# Patient Record
Sex: Female | Born: 1955 | Race: White | Hispanic: No | Marital: Married | Smoking: Never smoker
Health system: Southern US, Community
[De-identification: ages and names within clinical notes are randomized; demographics above are authoritative.]

## PROBLEM LIST (undated history)

## (undated) DIAGNOSIS — J45909 Unspecified asthma, uncomplicated: Secondary | ICD-10-CM

## (undated) DIAGNOSIS — I10 Essential (primary) hypertension: Secondary | ICD-10-CM

## (undated) HISTORY — DX: Essential (primary) hypertension: I10

## (undated) HISTORY — PX: LAPAROSCOPIC HYSTERECTOMY: SHX1926

## (undated) HISTORY — PX: SHOULDER ARTHROSCOPY: SHX128

## (undated) HISTORY — PX: AUGMENTATION MAMMAPLASTY: SUR837

## (undated) HISTORY — PX: BREAST BIOPSY: SHX20

## (undated) HISTORY — DX: Unspecified asthma, uncomplicated: J45.909

---

## 1998-02-13 ENCOUNTER — Ambulatory Visit (HOSPITAL_COMMUNITY): Admission: RE | Admit: 1998-02-13 | Discharge: 1998-02-13 | Payer: Self-pay | Admitting: Gynecology

## 1998-12-04 ENCOUNTER — Other Ambulatory Visit: Admission: RE | Admit: 1998-12-04 | Discharge: 1998-12-04 | Payer: Self-pay | Admitting: *Deleted

## 1999-02-16 ENCOUNTER — Ambulatory Visit (HOSPITAL_COMMUNITY): Admission: RE | Admit: 1999-02-16 | Discharge: 1999-02-16 | Payer: Self-pay | Admitting: Gynecology

## 1999-02-16 ENCOUNTER — Encounter: Payer: Self-pay | Admitting: Gynecology

## 1999-04-29 ENCOUNTER — Other Ambulatory Visit: Admission: RE | Admit: 1999-04-29 | Discharge: 1999-04-29 | Payer: Self-pay | Admitting: Gynecology

## 2000-02-24 ENCOUNTER — Encounter: Payer: Self-pay | Admitting: Gynecology

## 2000-02-24 ENCOUNTER — Encounter: Admission: RE | Admit: 2000-02-24 | Discharge: 2000-02-24 | Payer: Self-pay | Admitting: Gynecology

## 2000-04-05 ENCOUNTER — Other Ambulatory Visit: Admission: RE | Admit: 2000-04-05 | Discharge: 2000-04-05 | Payer: Self-pay | Admitting: Gynecology

## 2000-08-03 ENCOUNTER — Other Ambulatory Visit: Admission: RE | Admit: 2000-08-03 | Discharge: 2000-08-03 | Payer: Self-pay | Admitting: Gynecology

## 2001-03-08 ENCOUNTER — Encounter: Payer: Self-pay | Admitting: Gynecology

## 2001-03-08 ENCOUNTER — Ambulatory Visit (HOSPITAL_COMMUNITY): Admission: RE | Admit: 2001-03-08 | Discharge: 2001-03-08 | Payer: Self-pay | Admitting: Pediatrics

## 2001-04-12 ENCOUNTER — Other Ambulatory Visit: Admission: RE | Admit: 2001-04-12 | Discharge: 2001-04-12 | Payer: Self-pay | Admitting: Gynecology

## 2002-04-04 ENCOUNTER — Encounter: Admission: RE | Admit: 2002-04-04 | Discharge: 2002-04-04 | Payer: Self-pay | Admitting: Gynecology

## 2002-04-04 ENCOUNTER — Encounter: Payer: Self-pay | Admitting: Gynecology

## 2002-04-04 ENCOUNTER — Other Ambulatory Visit: Admission: RE | Admit: 2002-04-04 | Discharge: 2002-04-04 | Payer: Self-pay | Admitting: Gynecology

## 2003-03-27 ENCOUNTER — Other Ambulatory Visit: Admission: RE | Admit: 2003-03-27 | Discharge: 2003-03-27 | Payer: Self-pay | Admitting: Gynecology

## 2003-03-27 ENCOUNTER — Encounter: Admission: RE | Admit: 2003-03-27 | Discharge: 2003-03-27 | Payer: Self-pay | Admitting: Gynecology

## 2003-03-27 ENCOUNTER — Encounter: Payer: Self-pay | Admitting: Gynecology

## 2003-04-30 ENCOUNTER — Encounter (INDEPENDENT_AMBULATORY_CARE_PROVIDER_SITE_OTHER): Payer: Self-pay | Admitting: *Deleted

## 2003-04-30 ENCOUNTER — Ambulatory Visit (HOSPITAL_COMMUNITY): Admission: RE | Admit: 2003-04-30 | Discharge: 2003-04-30 | Payer: Self-pay | Admitting: General Surgery

## 2003-04-30 ENCOUNTER — Ambulatory Visit (HOSPITAL_BASED_OUTPATIENT_CLINIC_OR_DEPARTMENT_OTHER): Admission: RE | Admit: 2003-04-30 | Discharge: 2003-04-30 | Payer: Self-pay | Admitting: General Surgery

## 2004-04-08 ENCOUNTER — Other Ambulatory Visit: Admission: RE | Admit: 2004-04-08 | Discharge: 2004-04-08 | Payer: Self-pay | Admitting: Gynecology

## 2004-04-29 ENCOUNTER — Encounter: Admission: RE | Admit: 2004-04-29 | Discharge: 2004-04-29 | Payer: Self-pay | Admitting: Gynecology

## 2004-07-08 ENCOUNTER — Ambulatory Visit: Payer: Self-pay | Admitting: Internal Medicine

## 2004-07-22 ENCOUNTER — Encounter: Admission: RE | Admit: 2004-07-22 | Discharge: 2004-07-22 | Payer: Self-pay | Admitting: Internal Medicine

## 2004-08-19 ENCOUNTER — Ambulatory Visit: Payer: Self-pay | Admitting: Internal Medicine

## 2004-08-19 ENCOUNTER — Ambulatory Visit: Admission: RE | Admit: 2004-08-19 | Discharge: 2004-08-19 | Payer: Self-pay | Admitting: Internal Medicine

## 2004-08-19 ENCOUNTER — Encounter (INDEPENDENT_AMBULATORY_CARE_PROVIDER_SITE_OTHER): Payer: Self-pay | Admitting: *Deleted

## 2004-08-31 ENCOUNTER — Ambulatory Visit: Payer: Self-pay | Admitting: Internal Medicine

## 2004-09-23 ENCOUNTER — Ambulatory Visit (HOSPITAL_COMMUNITY): Admission: RE | Admit: 2004-09-23 | Discharge: 2004-09-23 | Payer: Self-pay | Admitting: *Deleted

## 2005-04-07 ENCOUNTER — Other Ambulatory Visit: Admission: RE | Admit: 2005-04-07 | Discharge: 2005-04-07 | Payer: Self-pay | Admitting: Gynecology

## 2005-05-12 ENCOUNTER — Encounter: Admission: RE | Admit: 2005-05-12 | Discharge: 2005-05-12 | Payer: Self-pay | Admitting: Gynecology

## 2005-09-30 ENCOUNTER — Encounter: Admission: RE | Admit: 2005-09-30 | Discharge: 2005-09-30 | Payer: Self-pay | Admitting: Gynecology

## 2005-12-29 ENCOUNTER — Ambulatory Visit: Payer: Self-pay | Admitting: Internal Medicine

## 2006-02-16 ENCOUNTER — Ambulatory Visit: Payer: Self-pay | Admitting: Internal Medicine

## 2006-04-18 ENCOUNTER — Encounter: Admission: RE | Admit: 2006-04-18 | Discharge: 2006-04-18 | Payer: Self-pay | Admitting: Gynecology

## 2006-04-20 ENCOUNTER — Other Ambulatory Visit: Admission: RE | Admit: 2006-04-20 | Discharge: 2006-04-20 | Payer: Self-pay | Admitting: Gynecology

## 2006-05-18 ENCOUNTER — Encounter: Admission: RE | Admit: 2006-05-18 | Discharge: 2006-05-18 | Payer: Self-pay | Admitting: Gynecology

## 2006-12-26 ENCOUNTER — Inpatient Hospital Stay (HOSPITAL_COMMUNITY): Admission: AD | Admit: 2006-12-26 | Discharge: 2006-12-26 | Payer: Self-pay | Admitting: Obstetrics & Gynecology

## 2007-05-24 ENCOUNTER — Other Ambulatory Visit: Admission: RE | Admit: 2007-05-24 | Discharge: 2007-05-24 | Payer: Self-pay | Admitting: Gynecology

## 2007-05-24 ENCOUNTER — Encounter: Admission: RE | Admit: 2007-05-24 | Discharge: 2007-05-24 | Payer: Self-pay | Admitting: Gynecology

## 2007-06-01 ENCOUNTER — Encounter: Admission: RE | Admit: 2007-06-01 | Discharge: 2007-06-01 | Payer: Self-pay | Admitting: Gynecology

## 2008-05-15 ENCOUNTER — Other Ambulatory Visit: Admission: RE | Admit: 2008-05-15 | Discharge: 2008-05-15 | Payer: Self-pay | Admitting: Gynecology

## 2008-05-29 ENCOUNTER — Encounter: Admission: RE | Admit: 2008-05-29 | Discharge: 2008-05-29 | Payer: Self-pay | Admitting: Gynecology

## 2009-07-16 ENCOUNTER — Encounter: Admission: RE | Admit: 2009-07-16 | Discharge: 2009-07-16 | Payer: Self-pay | Admitting: Gynecology

## 2010-04-26 ENCOUNTER — Encounter: Admission: RE | Admit: 2010-04-26 | Discharge: 2010-04-26 | Payer: Self-pay | Admitting: Gynecology

## 2010-06-24 ENCOUNTER — Encounter: Admission: RE | Admit: 2010-06-24 | Discharge: 2010-06-24 | Payer: Self-pay | Admitting: Gynecology

## 2010-12-10 NOTE — Op Note (Signed)
   Donna King, Donna King                        ACCOUNT NO.:  [id number]   MEDICAL RECORD NO.:  [id number]                   PATIENT TYPE:  AMB   LOCATION:  DSC                                  FACILITY:  [facility]   PHYSICIAN:  Rose Phi. Maple Hudson, M.D.                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   OPERATION:  Excision of right breast mass.   SURGEON:  Rose Phi. Maple Hudson, M.D.   ANESTHESIA:  MAC.   OPERATIVE PROCEDURE:  The patient placed on the operating table with both  arms extended on the arm board.  We carefully marked where the small  palpable nodule was and then prepped and draped.  A small incision was then  outlined and the area infiltrated with 1% Xylocaine with adrenalin.  An  incision was made and the nodule was excised.  Hemostasis was obtained with  the cautery.   It appeared to be a benign fibroadenoma.   The deeper breast tissue was approximated with 3-0 Vicryl and the skin with  a subcuticular 4-0 Monocryl and Steri-Strips.  A dressing applied.  The  patient transferred to the recovery room in satisfactory condition, having  tolerated the procedure well.                                               Rose Phi. Maple Hudson, M.D.    PRY/MEDQ  D:  04/30/2003  T:  04/30/2003  Job:  [id number]

## 2010-12-10 NOTE — Op Note (Signed)
[name], Donna King              ACCOUNT NO.:  [id number]   MEDICAL RECORD NO.:  [id number]          PATIENT TYPE:  OUT   LOCATION:  CARD                         FACILITY:  [facility]   PHYSICIAN:  Charlaine Dalton. Sherene Sires, M.D. [facility] OF BIRTH:  31-Jan-1956   DATE OF PROCEDURE:  DATE OF DISCHARGE:                                 OPERATIVE REPORT   PROCEDURE:  Fiberoptic bronchoscopy with transbronchial biopsy of the left  lower lobe.   HISTORY AND INDICATIONS:  A 55 year old white female with chronic productive  cough and patches of reticular nodular infiltrates in the right lower and  left lower lobes.  Please see dictated office consultation notes for further  information.  The patient agrees to the procedure after full discussion of  risks, benefits, and alternatives in the office.   The procedure was performed in the bronchoscopy suite with continuous  monitoring by surface ECG and oximetry.  She received a total of 50 mg of IV  Demerol and 5 mg of IV Versed anesthesia and cough suppression.   The right naris and oropharynx were anesthetized with 1% lidocaine by  updraft nebulizer, and the right naris was additionally prepared with 2%  lidocaine jelly.   Using a standard video fiberoptic bronchoscope, the right naris was easily  cannulated with good visualization of the entire oropharynx and larynx.  The  cords moved normally, and there were no apparent upper airway lesions.   Using an additional 1% lidocaine anesthesia, the entire tracheobronchial  tree was explored with the following findings.  There were diffuse  mucopurulent secretions retained throughout all the major airways, equal  bilaterally.  The airways were suctioned free of these secretions, and the  secretions were sent for the usual studies.   PROCEDURE:  Using a transbronchial approach at the posterobasal segment on  the left (which corresponded to an area of patchy increased density on CT  scan), I obtained four  adequate biopsies by standard transbronchial  technique, using fluoroscopic guidance.  There was no significant evidence  of intrabronchial bleeding or pneumothorax by fluoroscopy, with a follow-up  chest x-ray showing no evidence of pneumothorax.   The patient did develop mild left pleuritic pain postprocedure, consistent  with either a very small pneumothorax or pleuritis at the subpleural  location of the biopsy.  It should be treated conservatively with analgesic,  and we will follow her up as an outpatient.   IMPRESSION:  Pulmonary infiltrates of undetermined etiology, associated with  copious mucopurulent secretions of undetermined etiology as well.  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      MBW/MEDQ  D:  08/19/2004  T:  08/19/2004  Job:  [id number]   cc:   Zola Button T. Lazarus Salines, M.D.  [address] [place]  [place] [address]  Fax: [phone]

## 2011-05-12 LAB — URINALYSIS, ROUTINE W REFLEX MICROSCOPIC
Glucose, UA: NEGATIVE
Ketones, ur: 15 — AB
Nitrite: NEGATIVE
Protein, ur: 30 — AB
Specific Gravity, Urine: 1.025
Urobilinogen, UA: 1
pH: 6

## 2011-05-12 LAB — URINE MICROSCOPIC-ADD ON

## 2011-05-16 ENCOUNTER — Other Ambulatory Visit: Payer: Self-pay | Admitting: Gynecology

## 2011-05-16 DIAGNOSIS — Z1231 Encounter for screening mammogram for malignant neoplasm of breast: Secondary | ICD-10-CM

## 2011-06-30 ENCOUNTER — Ambulatory Visit: Payer: Self-pay

## 2011-07-14 ENCOUNTER — Ambulatory Visit
Admission: RE | Admit: 2011-07-14 | Discharge: 2011-07-14 | Disposition: A | Discharge status: Home / Self Care | Payer: BC Managed Care – PPO | Source: Ambulatory Visit | Attending: Gynecology | Admitting: Gynecology

## 2011-07-14 DIAGNOSIS — Z1231 Encounter for screening mammogram for malignant neoplasm of breast: Secondary | ICD-10-CM

## 2012-02-02 ENCOUNTER — Other Ambulatory Visit: Payer: Self-pay | Admitting: Plastic Surgery

## 2012-06-04 ENCOUNTER — Other Ambulatory Visit: Payer: Self-pay | Admitting: Gynecology

## 2012-06-04 DIAGNOSIS — Z1231 Encounter for screening mammogram for malignant neoplasm of breast: Secondary | ICD-10-CM

## 2012-07-02 ENCOUNTER — Other Ambulatory Visit: Payer: Self-pay | Admitting: Gynecology

## 2012-07-02 DIAGNOSIS — Z9889 Other specified postprocedural states: Secondary | ICD-10-CM

## 2012-07-02 DIAGNOSIS — N63 Unspecified lump in unspecified breast: Secondary | ICD-10-CM

## 2012-07-19 ENCOUNTER — Ambulatory Visit
Admission: RE | Admit: 2012-07-19 | Discharge: 2012-07-19 | Disposition: A | Discharge status: Home / Self Care | Payer: BC Managed Care – PPO | Source: Ambulatory Visit | Attending: Gynecology | Admitting: Gynecology

## 2012-07-19 DIAGNOSIS — N63 Unspecified lump in unspecified breast: Secondary | ICD-10-CM

## 2012-07-19 DIAGNOSIS — Z1231 Encounter for screening mammogram for malignant neoplasm of breast: Secondary | ICD-10-CM

## 2012-07-19 DIAGNOSIS — Z9889 Other specified postprocedural states: Secondary | ICD-10-CM

## 2012-07-19 MED ORDER — GADOBENATE DIMEGLUMINE 529 MG/ML IV SOLN
12.0000 mL | Freq: Once | INTRAVENOUS | Status: AC | PRN
  Administered 2012-07-19: 12 mL via INTRAVENOUS

## 2012-09-26 ENCOUNTER — Encounter: Payer: Self-pay | Admitting: Obstetrics & Gynecology

## 2012-09-29 ENCOUNTER — Other Ambulatory Visit: Payer: Self-pay | Admitting: Obstetrics & Gynecology

## 2012-10-02 ENCOUNTER — Other Ambulatory Visit: Payer: Self-pay | Admitting: Obstetrics & Gynecology

## 2013-03-07 ENCOUNTER — Other Ambulatory Visit: Payer: Self-pay | Admitting: Obstetrics and Gynecology

## 2013-03-07 MED ORDER — MOMETASONE FUROATE 220 MCG/INH IN AEPB: 2.0000 | INHALATION_SPRAY | Freq: Every day | RESPIRATORY_TRACT | Status: DC

## 2013-06-14 ENCOUNTER — Other Ambulatory Visit: Payer: Self-pay

## 2013-06-14 DIAGNOSIS — Z1231 Encounter for screening mammogram for malignant neoplasm of breast: Secondary | ICD-10-CM

## 2013-07-22 ENCOUNTER — Ambulatory Visit
Admission: RE | Admit: 2013-07-22 | Discharge: 2013-07-22 | Disposition: A | Discharge status: Home / Self Care | Payer: BC Managed Care – PPO | Source: Ambulatory Visit

## 2013-07-22 ENCOUNTER — Other Ambulatory Visit: Payer: Self-pay | Admitting: Gynecology

## 2013-07-22 ENCOUNTER — Ambulatory Visit
Admission: RE | Admit: 2013-07-22 | Discharge: 2013-07-22 | Disposition: A | Discharge status: Home / Self Care | Payer: BC Managed Care – PPO | Source: Ambulatory Visit | Attending: Gynecology | Admitting: Gynecology

## 2013-07-22 DIAGNOSIS — N6320 Unspecified lump in the left breast, unspecified quadrant: Secondary | ICD-10-CM

## 2013-07-22 DIAGNOSIS — Z1231 Encounter for screening mammogram for malignant neoplasm of breast: Secondary | ICD-10-CM

## 2013-12-24 ENCOUNTER — Other Ambulatory Visit: Payer: Self-pay | Admitting: Gynecology

## 2013-12-24 DIAGNOSIS — N63 Unspecified lump in unspecified breast: Secondary | ICD-10-CM

## 2014-01-06 ENCOUNTER — Ambulatory Visit
Admission: RE | Admit: 2014-01-06 | Discharge: 2014-01-06 | Disposition: A | Discharge status: Home / Self Care | Payer: BC Managed Care – PPO | Source: Ambulatory Visit | Attending: Gynecology | Admitting: Gynecology

## 2014-01-06 DIAGNOSIS — N63 Unspecified lump in unspecified breast: Secondary | ICD-10-CM

## 2014-02-11 ENCOUNTER — Other Ambulatory Visit: Payer: Self-pay | Admitting: Internal Medicine

## 2014-02-11 DIAGNOSIS — N6002 Solitary cyst of left breast: Secondary | ICD-10-CM

## 2014-07-09 ENCOUNTER — Other Ambulatory Visit: Payer: Self-pay | Admitting: Dermatology

## 2014-07-23 ENCOUNTER — Ambulatory Visit
Admission: RE | Admit: 2014-07-23 | Discharge: 2014-07-23 | Disposition: A | Discharge status: Home / Self Care | Payer: BC Managed Care – PPO | Source: Ambulatory Visit | Attending: Internal Medicine | Admitting: Internal Medicine

## 2014-07-23 ENCOUNTER — Other Ambulatory Visit: Payer: Self-pay | Admitting: Internal Medicine

## 2014-07-23 DIAGNOSIS — N6002 Solitary cyst of left breast: Secondary | ICD-10-CM

## 2014-09-05 DIAGNOSIS — R002 Palpitations: Secondary | ICD-10-CM | POA: Insufficient documentation

## 2014-10-13 ENCOUNTER — Other Ambulatory Visit: Payer: Self-pay | Admitting: *Deleted

## 2014-10-13 ENCOUNTER — Encounter: Payer: Self-pay | Admitting: *Deleted

## 2014-10-14 ENCOUNTER — Encounter: Payer: Self-pay | Admitting: Cardiology

## 2014-10-14 ENCOUNTER — Ambulatory Visit (INDEPENDENT_AMBULATORY_CARE_PROVIDER_SITE_OTHER): Payer: No Typology Code available for payment source | Admitting: Cardiology

## 2014-10-14 VITALS — BP 152/90 | HR 91 | Ht 66.0 in | Wt 142.4 lb

## 2014-10-14 DIAGNOSIS — I951 Orthostatic hypotension: Secondary | ICD-10-CM | POA: Diagnosis not present

## 2014-10-14 DIAGNOSIS — I471 Supraventricular tachycardia: Secondary | ICD-10-CM

## 2014-10-14 DIAGNOSIS — G90A Postural orthostatic tachycardia syndrome (POTS): Secondary | ICD-10-CM

## 2014-10-14 DIAGNOSIS — I10 Essential (primary) hypertension: Secondary | ICD-10-CM

## 2014-10-14 DIAGNOSIS — R000 Tachycardia, unspecified: Secondary | ICD-10-CM

## 2014-10-14 DIAGNOSIS — I4711 Inappropriate sinus tachycardia, so stated: Secondary | ICD-10-CM | POA: Insufficient documentation

## 2014-10-14 DIAGNOSIS — I498 Other specified cardiac arrhythmias: Secondary | ICD-10-CM

## 2014-10-14 MED ORDER — DILTIAZEM HCL ER COATED BEADS 360 MG PO CP24: 360.0000 mg | ORAL_CAPSULE | Freq: Every day | ORAL | Status: DC

## 2014-10-14 NOTE — Patient Instructions (Signed)
Your physician has recommended you make the following change in your medication:   INCREASE YOUR CARDIZEM TO 360 MG ONCE DAILY    Your physician recommends that you schedule a follow-up appointment in: ONE MONTH WITH DR Delton SeeNELSON     WE WILL REQUEST YOUR RECENT ECHO AND EKG FROM [facility]

## 2014-10-14 NOTE — Progress Notes (Signed)
Patient ID: Donna King, female   DOB: 09/05/1955, 59 y.o.   MRN: [id number]      Cardiology Office Note  Date:  10/14/2014   ID:  Rhapsody, Wolven Sep 19, 1955, MRN [id number]  PCP:  No primary care provider on file.  Cardiologist:  Lars Masson, MD   Chief complain: Tachycardia, palpitations  History of Present Illness: Donna King is a 59 y.o. female who presents for evaluation of tachycardia and palpitations. 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 She was then tried on guanfacine that is alpha 2 blocker that controlled her BO but further increased her HR. She is symptomatic with palpitations and fatigue when tachycardic and she is concern about long term consequences. She denies any chest pain, DOE or syncope, no orthopnea or PND. She has an ECG done that was normal and TTE that showed normal LV wall thickness, normal LVEF, trivial MR.  She was just started on cardizem 240 mg po daily that doesn't control her symptoms and hypertension. Her recent TSH, fT3,4 were normal.   Past Medical History  Diagnosis Date  . RAD (reactive airway disease)   . HTN (hypertension)   . Essential (primary) hypertension   . Asthma     Past Surgical History  Procedure Laterality Date  . Laparoscopic hysterectomy    . Breast biopsy      x3     Current Outpatient Prescriptions  Medication Sig Dispense Refill  . augmented betamethasone dipropionate (DIPROLENE-AF) 0.05 % cream Apply 1 application topically at bedtime.    Marland Kitchen diltiazem (CARDIZEM CD) 240 MG 24 hr capsule Take 240 mg by mouth daily.    . metFORMIN (GLUMETZA) 500 MG  (MOD) 24 hr tablet Take 1,000 mg by mouth daily with breakfast.    . mometasone (ASMANEX) 220 MCG/INH inhaler Inhale 2 puffs into the lungs daily. (Patient taking differently: Inhale 2 puffs into the lungs as needed (for reactive airway disease). ) 1 Inhaler 12   No current facility-administered medications for this visit.    Allergies:   Sulfa antibiotics    Social History:  The patient  reports that she has never smoked. She does not have any smokeless tobacco history on file. She reports that she drinks alcohol. She reports that she does not use illicit drugs.   Family History:  The patient's family history includes Cancer in her maternal grandmother; Heart disease in her father; Hypertension in her sister; Thyroid disease in her mother.    ROS:  Please see the history of present illness.   Otherwise, review of systems are positive for none.   All other systems are reviewed and negative.    PHYSICAL EXAM: VS:  BP 152/90 mmHg  Pulse 91  Ht  (1.676 m)  Wt 142 lb 6.4 oz (64.592 kg)  BMI 22.99 kg/m2 , BMI Body mass index is 22.99 kg/(m^2). GEN: Well nourished, well developed, in no acute distress HEENT: normal Neck: no JVD, carotid bruits, or masses Cardiac: RRR; no murmurs, rubs, or gallops,no edema  Respiratory:  clear to auscultation bilaterally, normal work of breathing GI: soft, nontender, nondistended, + BS MS: no deformity or atrophy Skin:  warm and dry, no rash Neuro:  Strength and sensation are intact Psych: euthymic mood, full affect   EKG:  EKG is not ordered today. The ekg from PCP office done on 09/01/2014 is normal.   Recent Labs: No results found for requested labs within last 365 days.    Lipid Panel No results found for: CHOL, TRIG, HDL, CHOLHDL, VLDL, LDLCALC, LDLDIRECT    Wt Readings from Last 3 Encounters:  10/14/14 142 lb 6.4 oz (64.592 kg)      Other studies Reviewed: Additional studies/ records that were reviewed today include: TTE from  [place], decribed in HPI. Marland Kitchen.    ASSESSMENT AND PLAN:  59 year old female  1. Inappropriate sinus tachycardia - we will increase cardizem CD to 360 mg po daily. If further control needed, we will increase to 480 mg po daily. The last resort would be corlanor (ivabradine).   2. POTS - as above  3. Hypertension - we will increase we will increase cardizem CD to 360 mg po daily. If further control needed, we will increase to 480 mg po daily. The patient will check at home and email us BP measurements.   The patient is inquiring about medication called Rilmenidine, upon review, this medication is not sold (not FDA approved in [place]), but available in multiple [place] countries.  Rilmenidine is a antihypertensive agent whose mechanism of action via a sympatholytic effect at the medullary vasomotor centre. In experimental studies, it was shown that administration of oral or intravenous rilmenidine to hypertensive mice decreased sympathetic tonus, heart rate and blood pressure. In human studies, BP control was proved, however HR was only improved at rest but not at stress (Cardiac Autonomic Function).Administration of a single 1 mg dose of rilmenidine does not affect sympathovagal balance nor vagal and sympathetic modulation during exercise in healthy volunteers. At rest it increases vagal tonus without changing vagal modulation.   I would not recommend this drug for this patient and would start by increasing cardizem dose as stated above.   Current medicines are reviewed at length with the patient today.  The patient has concerns regarding medicines.   No orders of the defined types were placed in this encounter.     Disposition:   FU with Tobias AlexanderNELSON, Brigitte Soderberg H  in 1 month   Signed, Lars MassonNELSON, Brenn Deziel H, MD  10/14/2014 9:35 AM    [facility] [address], [place], [place]  [address] Phone: [phone]; Fax: [phone]   ]

## 2014-10-15 ENCOUNTER — Telehealth: Payer: Self-pay | Admitting: Cardiology

## 2014-10-15 ENCOUNTER — Encounter: Payer: Self-pay | Admitting: Cardiology

## 2014-10-15 MED ORDER — DILTIAZEM HCL ER COATED BEADS 240 MG PO CP24: 240.0000 mg | ORAL_CAPSULE | Freq: Every day | ORAL | Status: DC

## 2014-10-15 MED ORDER — DILTIAZEM HCL ER COATED BEADS 120 MG PO CP24: 120.0000 mg | ORAL_CAPSULE | Freq: Every day | ORAL | Status: DC

## 2014-10-15 NOTE — Telephone Encounter (Signed)
-----   Message from Lars MassonKatarina H Nelson, MD sent at 10/14/2014  9:11 PM EDT ----- Lajoyce CornersIvy, We have ordered this patient 360 mg of Cardizem, that capsule contains dye that she is allergic to. Could you call her pharmacy for a prescription of Cardizem CD 240 PO daily and Cardizem CD 120 mg po daily.  Thank you, KN

## 2014-10-15 NOTE — Telephone Encounter (Signed)
New Msg    Pt c/o medication issue:  1. Name of Medication: Cartia   2. How are you currently taking this medication (dosage and times per day)? Two different strengths   3. Are you having a reaction (difficulty breathing--STAT)? No   4. What is your medication issue? Prescription was written for the same medication in two different strengths.   Should pt be taking both of these?  Please return call.

## 2014-10-15 NOTE — Telephone Encounter (Signed)
Discontinued the pts Cardizem 360 mg per Dr Delton SeeNelson for blue dye allergy, and ordered for the pt to take Cardizem CD 240 mg PO daily and Cardizem CD 120 mg po daily in place of that, per Dr Delton SeeNelson.  Sent both orders to pts pharmacy of choice.  Updated the pts allergies to blue dye in her chart.  Made the pt aware of new changes per Dr Delton SeeNelson through a mychart message.

## 2014-10-28 ENCOUNTER — Encounter: Payer: Self-pay | Admitting: Cardiology

## 2014-10-28 MED ORDER — VALSARTAN 80 MG PO TABS
80.0000 mg | ORAL_TABLET | Freq: Every day | ORAL | Status: DC
Start: 2014-10-28 — End: 2015-10-08

## 2014-10-28 NOTE — Telephone Encounter (Signed)
Prescription for valsartan 80 mg po daily called into pts pharmacy of choice per Dr Delton SeeNelson.  Will inform the pt that [facility]

## 2014-11-21 ENCOUNTER — Encounter: Payer: Self-pay | Admitting: Cardiology

## 2014-11-21 ENCOUNTER — Ambulatory Visit (INDEPENDENT_AMBULATORY_CARE_PROVIDER_SITE_OTHER): Payer: No Typology Code available for payment source | Admitting: Cardiology

## 2014-11-21 VITALS — BP 106/70 | HR 76 | Ht 66.0 in | Wt 142.4 lb

## 2014-11-21 DIAGNOSIS — I471 Supraventricular tachycardia: Secondary | ICD-10-CM

## 2014-11-21 DIAGNOSIS — I951 Orthostatic hypotension: Secondary | ICD-10-CM

## 2014-11-21 DIAGNOSIS — R000 Tachycardia, unspecified: Secondary | ICD-10-CM | POA: Diagnosis not present

## 2014-11-21 DIAGNOSIS — G90A Postural orthostatic tachycardia syndrome (POTS): Secondary | ICD-10-CM

## 2014-11-21 DIAGNOSIS — I10 Essential (primary) hypertension: Secondary | ICD-10-CM | POA: Diagnosis not present

## 2014-11-21 DIAGNOSIS — I498 Other specified cardiac arrhythmias: Secondary | ICD-10-CM

## 2014-11-21 MED ORDER — IVABRADINE HCL 5 MG PO TABS: 5.0000 mg | ORAL_TABLET | Freq: Two times a day (BID) | ORAL | Status: DC

## 2014-11-21 NOTE — Patient Instructions (Signed)
Medication Instructions:  Your physician has recommended you make the following change in your medication:  1. START Corlanor 5mg  take one by mouth twice a day  Labwork: No new orders.  Testing/Procedures: No new orders.  Follow-Up: Your physician recommends that you schedule a follow-up appointment in: 3 MONTHS with Dr Delton SeeNelson   Any Other Special Instructions Will Be Listed Below (If Applicable).

## 2014-11-21 NOTE — Progress Notes (Signed)
Patient ID: Donna King, female   DOB: Nov 15, 1955, 59 y.o.   MRN: [id number]      Cardiology Office Note  Date:  11/21/2014   ID:  Donna King, DOB Nov 15, 1955, MRN [id number]  PCP:  No primary care provider on file.  Cardiologist:  Lars MassonNELSON, Ali Mclaurin H, MD   Chief complain: Tachycardia, palpitations  History of Present Illness: Donna King is a 59 y.o. female who presents for evaluation of tachycardia and palpitations. 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 She was then tried on guanfacine that is alpha 2 blocker that controlled her BO but further increased her HR. She is symptomatic with palpitations and fatigue when tachycardic and she is concern about long term consequences. She denies any chest pain, DOE or syncope, no orthopnea or PND. She has an ECG done that was normal and TTE that showed normal LV wall thickness, normal LVEF, trivial MR.  She was just started on cardizem 240 mg po daily that doesn't control her symptoms and hypertension. Her recent TSH, fT3,4 were normal.  11/21/2014 - patient is coming after 1 months, in the past combination of Cardizem valsartan didn't control her blood pressure well, addition of coronary control her heart rate very well but she developed pauses at night. She cut down Cardizem to 120 daily valsartan 80 mg daily and is using coronary twice daily. She feels significantly better with improved functional capacity decreased fatigue and overall well-being.   Past Medical History  Diagnosis Date  . RAD (reactive airway disease)   . HTN (hypertension)   . Essential (primary) hypertension     . Asthma     Past Surgical History  Procedure Laterality Date  . Laparoscopic hysterectomy    . Breast biopsy      x3     Current Outpatient Prescriptions  Medication Sig Dispense Refill  . augmented betamethasone dipropionate (DIPROLENE-AF) 0.05 % cream Apply 1 application topically at bedtime.    Marland Kitchen. diltiazem (CARDIZEM CD) 120 MG 24 hr capsule Take 1 capsule (120 mg total) by mouth daily. 90 capsule 3  . ivabradine (CORLANOR) 5 MG TABS tablet Take 5 mg by mouth 2 (two) times daily with a meal.    . metFORMIN (GLUMETZA) 500 MG (MOD) 24 hr tablet Take 1,000 mg by mouth daily with breakfast.    . mometasone (ASMANEX) 220 MCG/INH inhaler Inhale 2 puffs into the lungs daily as needed.    . valsartan (DIOVAN) 80 MG tablet Take 1 tablet (80 mg total) by mouth daily. 90 tablet 3   No current facility-administered medications for this visit.    Allergies:   Blue dyes (parenteral) and Sulfa antibiotics    Social History:  The patient  reports that she has never smoked. She does not have any smokeless tobacco history on file. She reports that she drinks alcohol. She reports that she does not use illicit drugs.   Family History:  The patient's family history includes Cancer in her maternal grandmother; Heart disease in her father; Hypertension in her sister; Thyroid disease in her mother.   ROS:  Please see the history of present illness.   Otherwise, review of systems  are positive for none.   All other systems are reviewed and negative.   PHYSICAL EXAM: VS:  BP 106/70 mmHg  Pulse 76  Ht  (1.676 m)  Wt 142 lb 6.4 oz (64.592 kg)  BMI 22.99 kg/m2  SpO2 99% , BMI Body mass index is 22.99 kg/(m^2). GEN: Well nourished, well developed, in no acute distress HEENT: normal Neck: no JVD, carotid bruits, or masses Cardiac: RRR; no murmurs, rubs, or gallops,no edema  Respiratory:  clear to auscultation bilaterally, normal work of breathing GI: soft, nontender, nondistended, + BS MS: no  deformity or atrophy Skin: warm and dry, no rash Neuro:  Strength and sensation are intact Psych: euthymic mood, full affect   EKG:  EKG is not ordered today. The ekg from PCP office done on 09/01/2014 is normal.   Recent Labs: No results found for requested labs within last 365 days.    Lipid Panel No results found for: CHOL, TRIG, HDL, CHOLHDL, VLDL, LDLCALC, LDLDIRECT    Wt Readings from Last 3 Encounters:  11/21/14 142 lb 6.4 oz (64.592 kg)  10/14/14 142 lb 6.4 oz (64.592 kg)      Other studies Reviewed: Additional studies/ records that were reviewed today include: TTE from [place], decribed in HPI. Marland Kitchen    ASSESSMENT AND PLAN:  59 year old female  1. Inappropriate sinus tachycardia - combination of Corlanor or 5 mg twice a day and diltiazem 120 mg daily improved her symptoms significantly area we'll apply for Ivabradine approval for the patient.  2. POTS - as above  3. Hypertension - continue Cardizem 120 daily and valsartan any milligrams daily. Blood pressure is currently very well controlled..  The patient is inquiring about medication called Rilmenidine, upon review, this medication is not sold (not FDA approved in [place]), but available in multiple [place] countries.  Rilmenidine is a antihypertensive agent whose mechanism of action via a sympatholytic effect at the medullary vasomotor centre. In experimental studies, it was shown that administration of oral or intravenous rilmenidine to hypertensive mice decreased sympathetic tonus, heart rate and blood pressure. In human studies, BP control was proved, however HR was only improved at rest but not at stress (Cardiac Autonomic Function).Administration of a single 1 mg dose of rilmenidine does not affect sympathovagal balance nor vagal and sympathetic modulation during exercise in healthy volunteers. At rest it increases vagal tonus without changing vagal modulation.   I would not recommend this drug for this patient and  would start by increasing cardizem dose as stated above.  Current medicines are reviewed at length with the patient today.  The patient has concerns regarding medicines.  Disposition:   FU with Kensley Lares H  Follow up in 3 months.   Signed, Lars Masson, MD  11/21/2014 10:53 AM    [facility] [address] [place], [place], [place]  [address] Phone: [phone]; Fax: [phone]   ]

## 2014-12-04 ENCOUNTER — Telehealth: Payer: Self-pay

## 2014-12-04 NOTE — Telephone Encounter (Signed)
Prior auth for Corlanor 5mg  sent to [facility] via Cover my meds.

## 2014-12-17 ENCOUNTER — Telehealth: Payer: Self-pay

## 2014-12-18 NOTE — Telephone Encounter (Signed)
Attempted to call patient to discuss the denial of Corlanor, to see if she wanted to appeal it or wanted us to appeal, but she was in a meeting and couldn't talk.

## 2015-01-07 ENCOUNTER — Encounter: Payer: Self-pay | Admitting: Cardiology

## 2015-01-07 ENCOUNTER — Telehealth: Payer: Self-pay | Admitting: Cardiology

## 2015-01-07 NOTE — Telephone Encounter (Signed)
ERROR

## 2015-02-23 ENCOUNTER — Ambulatory Visit: Payer: No Typology Code available for payment source | Admitting: Cardiology

## 2015-03-05 ENCOUNTER — Ambulatory Visit: Payer: No Typology Code available for payment source | Admitting: Cardiology

## 2015-06-25 ENCOUNTER — Other Ambulatory Visit: Payer: Self-pay | Admitting: Gynecology

## 2015-06-25 ENCOUNTER — Other Ambulatory Visit: Payer: Self-pay | Admitting: Obstetrics and Gynecology

## 2015-06-25 DIAGNOSIS — N6320 Unspecified lump in the left breast, unspecified quadrant: Secondary | ICD-10-CM

## 2015-07-28 ENCOUNTER — Ambulatory Visit
Admission: RE | Admit: 2015-07-28 | Discharge: 2015-07-28 | Disposition: A | Discharge status: Home / Self Care | Payer: BLUE CROSS/BLUE SHIELD | Source: Ambulatory Visit | Attending: Gynecology | Admitting: Gynecology

## 2015-07-28 DIAGNOSIS — N6320 Unspecified lump in the left breast, unspecified quadrant: Secondary | ICD-10-CM

## 2015-09-24 ENCOUNTER — Encounter: Payer: Self-pay | Admitting: Obstetrics and Gynecology

## 2015-10-08 ENCOUNTER — Encounter: Payer: Self-pay | Admitting: Obstetrics and Gynecology

## 2015-10-08 ENCOUNTER — Ambulatory Visit (INDEPENDENT_AMBULATORY_CARE_PROVIDER_SITE_OTHER): Payer: BLUE CROSS/BLUE SHIELD | Admitting: Obstetrics and Gynecology

## 2015-10-08 VITALS — BP 138/78 | HR 64 | Resp 14 | Ht 66.0 in | Wt 148.0 lb

## 2015-10-08 DIAGNOSIS — Z01419 Encounter for gynecological examination (general) (routine) without abnormal findings: Secondary | ICD-10-CM | POA: Diagnosis not present

## 2015-10-08 DIAGNOSIS — Z79890 Hormone replacement therapy (postmenopausal): Secondary | ICD-10-CM

## 2015-10-08 DIAGNOSIS — Z5181 Encounter for therapeutic drug level monitoring: Secondary | ICD-10-CM

## 2015-10-08 NOTE — Patient Instructions (Signed)

## 2015-10-08 NOTE — Progress Notes (Signed)
Patient ID: Donna King, female   DOB: 1955/11/24, 60 y.o.   MRN: [id number] 60 y.o. [id number] MarriedCaucasianF here for annual exam.  TLH/BS for fibroids. She is on ERT doing well, she has tried the 0.75 dose and was having vasomotor symptoms. Sexually  She has developed lichen planus, treated with metformin. Helping some.     No LMP recorded. Patient has had a hysterectomy.          Sexually active: Yes.    The current method of family planning is status post hysterectomy.    Exercising: Yes.    cardio/ resistance/ ball room dance Smoker:  no  Health Maintenance: Pap:  20100 per patient  History of abnormal Pap:  no MMG:  07/28/15 -U/S- WNL  Colonoscopy:  2007 normal per patient  BMD:   Never TDaP:  Up to date Gardasil: N/A   reports that she has never smoked. She has never used smokeless tobacco. She reports that she drinks about 4.2 oz of alcohol per week. She reports that she does not use illicit drugs.Kids are 5732 and 6929. No grandchildren.   Past Medical History  Diagnosis Date  . RAD (reactive airway disease)   . HTN (hypertension)   . Essential (primary) hypertension   . Asthma     Past Surgical History  Procedure Laterality Date  . Laparoscopic hysterectomy    . Breast biopsy      x3    Current Outpatient Prescriptions  Medication Sig Dispense Refill  . augmented betamethasone dipropionate (DIPROLENE-AF) 0.05 % cream Apply 1 application topically at bedtime.    . Cholecalciferol (VITAMIN D) 2000 units tablet Take 2,000 Units by mouth daily.    Marland Kitchen. diltiazem (CARDIZEM CD) 120 MG 24 hr capsule Take 1 capsule (120 mg total) by mouth daily. 90 capsule 3  . estradiol (VIVELLE-DOT) 0.1 MG/24HR patch Place 1 patch onto the skin 2 (two) times a week.    . metFORMIN (GLUMETZA) 500 MG (MOD) 24 hr tablet Take 1,000 mg by mouth daily with breakfast.    . mometasone (ASMANEX) 220 MCG/INH inhaler Inhale 2 puffs into the lungs daily as needed.    . nebivolol (BYSTOLIC) 5 MG tablet  Take 5 mg by mouth daily.     No current facility-administered medications for this visit.    Family History  Problem Relation Age of Onset  . Thyroid disease Mother   . Heart disease Father   . Hypertension Sister   . Cancer Maternal Grandmother     pancrease    Review of Systems  Constitutional: Negative.   HENT: Negative.   Eyes: Negative.   Respiratory: Negative.   Cardiovascular: Negative.   Gastrointestinal: Negative.   Endocrine: Negative.   Genitourinary: Negative.   Musculoskeletal: Negative.   Skin: Negative.   Allergic/Immunologic: Negative.   Neurological: Negative.   Psychiatric/Behavioral: Negative.     Exam:   BP 138/78 mmHg  Pulse 64  Resp 14  Ht 5\' 6"  (1.676 m)  Wt 148 lb (67.132 kg)  BMI 23.90 kg/m2  Weight change: @WEIGHTCHANGE @ Height:   Height: 5\' 6"  (167.6 cm)  Ht Readings from Last 3 Encounters:  10/08/15 5\' 6"  (1.676 m)  11/21/14 5\' 6"  (1.676 m)  10/14/14 5\' 6"  (1.676 m)    General appearance: alert, cooperative and appears stated age Head: Normocephalic, without obvious abnormality, atraumatic Neck: no adenopathy, supple, symmetrical, trachea midline and thyroid normal to inspection and palpation Lungs: clear to auscultation bilaterally Breasts: normal appearance, no masses  or tenderness Heart: regular rate and rhythm Abdomen: soft, non-tender; bowel sounds normal; no masses,  no organomegaly Extremities: extremities normal, atraumatic, no cyanosis or edema Skin: Skin color, texture, turgor normal. No rashes or lesions Lymph nodes: Cervical, supraclavicular, and axillary nodes normal. No abnormal inguinal nodes palpated Neurologic: Grossly normal   Pelvic: External genitalia:  no lesions              Urethra:  normal appearing urethra with no masses, tenderness or lesions              Bartholins and Skenes: normal                 Vagina: normal appearing vagina with normal color and discharge, no lesions              Cervix:  absent               Bimanual Exam:  Uterus: absent              Adnexa: no mass, fullness, tenderness               Rectovaginal: Confirms               Anus:  normal sphincter tone, no lesions  Chaperone was present for exam.  A:  Well Woman with normal exam  On ERT, doing well, wants to continue. She is aware of the risks  P:   No pap needed  Mammogram UTD  She will set up her colonoscopy  She will call if she needs [place] to call in her ERT  Labs and immunizations with primary MD

## 2015-10-30 ENCOUNTER — Telehealth: Payer: Self-pay

## 2015-10-30 ENCOUNTER — Encounter: Payer: Self-pay | Admitting: Obstetrics and Gynecology

## 2015-10-30 DIAGNOSIS — Z0189 Encounter for other specified special examinations: Secondary | ICD-10-CM

## 2015-10-30 NOTE — Telephone Encounter (Signed)
Telephone encounter created to discuss mychart message with Dr.Jertson. 

## 2015-10-30 NOTE — Telephone Encounter (Signed)
Non-Urgent Medical Question  Message [phone]   From  Edwena FeltyCynthia P Hedgepath   To  Romualdo BolkJill Evelyn Jertson, MD   Sent  10/30/2015 4:37 PM     Could you please order some routine labs for me - CBC, CMP, TSH, Vit D, and cholesterol panel? Sorry, should have asked at my visit. I'll go to [facility] to have them drawn if you will just put the order in the computer. Thanks so much!      Responsible Party    Pool - [facility] Clinical Pool No one has taken responsibility for this message.     No actions have been taken on this message.     Routing to Dr.Jertson for review and advise.

## 2015-11-02 DIAGNOSIS — Z0000 Encounter for general adult medical examination without abnormal findings: Secondary | ICD-10-CM | POA: Diagnosis not present

## 2015-11-02 NOTE — Telephone Encounter (Signed)
I have placed future orders for CBC< CMP. TSH, Vitamin D, and cholesterol panel for patient to have labs done with [facility] who used to be [facility] per Beaver Damasey in lab. I have sent the patient a mychart response letting her know these orders have been placed.  Routing to provider for final review. Patient agreeable to disposition. Will close encounter.

## 2015-11-02 NOTE — Telephone Encounter (Signed)
Please place the order, routine labs. Thanks

## 2015-11-03 LAB — TSH: TSH: 0.92 mIU/L

## 2015-11-03 LAB — COMPLETE METABOLIC PANEL WITH GFR
ALT: 12 U/L (ref 6–29)
AST: 18 U/L (ref 10–35)
Albumin: 4.5 g/dL (ref 3.6–5.1)
Alkaline Phosphatase: 43 U/L (ref 33–130)
BILIRUBIN TOTAL: 0.7 mg/dL (ref 0.2–1.2)
BUN: 13 mg/dL (ref 7–25)
CO2: 24 mmol/L (ref 20–31)
CREATININE: 0.86 mg/dL (ref 0.50–1.05)
Calcium: 9.6 mg/dL (ref 8.6–10.4)
Chloride: 104 mmol/L (ref 98–110)
GFR, EST AFRICAN AMERICAN: 86 mL/min (ref >=60)
GFR, Est Non African American: 74 mL/min (ref >=60)
GLUCOSE: 94 mg/dL (ref 65–99)
Potassium: 4.4 mmol/L (ref 3.5–5.3)
SODIUM: 140 mmol/L (ref 135–146)
TOTAL PROTEIN: 7.1 g/dL (ref 6.1–8.1)

## 2015-11-03 LAB — LIPID PANEL
Cholesterol: 200 mg/dL (ref 125–200)
HDL: 63 mg/dL (ref >=46)
LDL CALC: 119 mg/dL (ref <130)
Total CHOL/HDL Ratio: 3.2 Ratio (ref <=5.0)
Triglycerides: 88 mg/dL (ref <150)
VLDL: 18 mg/dL (ref <30)

## 2015-11-04 LAB — CBC WITH DIFFERENTIAL/PLATELET
Basophils Absolute: 0 cells/uL (ref 0–200)
Basophils Relative: 0 %
Eosinophils Absolute: 63 cells/uL (ref 15–500)
Eosinophils Relative: 1 %
HEMATOCRIT: 42.6 % (ref 35.0–45.0)
HEMOGLOBIN: 13.9 g/dL (ref 11.7–15.5)
LYMPHS ABS: 1638 {cells}/uL (ref 850–3900)
Lymphocytes Relative: 26 %
MCH: 31 pg (ref 27.0–33.0)
MCHC: 32.6 g/dL (ref 32.0–36.0)
MCV: 94.9 fL (ref 80.0–100.0)
MONO ABS: 378 {cells}/uL (ref 200–950)
MPV: 10.1 fL (ref 7.5–12.5)
Monocytes Relative: 6 %
Neutro Abs: 4221 cells/uL (ref 1500–7800)
Neutrophils Relative %: 67 %
Platelets: 280 10*3/uL (ref 140–400)
RBC: 4.49 MIL/uL (ref 3.80–5.10)
RDW: 13 % (ref 11.0–15.0)
WBC: 6.3 10*3/uL (ref 3.8–10.8)

## 2015-11-04 LAB — VITAMIN D 25 HYDROXY (VIT D DEFICIENCY, FRACTURES): Vit D, 25-Hydroxy: 32 ng/mL (ref 30–100)

## 2015-12-22 ENCOUNTER — Ambulatory Visit
Admission: RE | Admit: 2015-12-22 | Discharge: 2015-12-22 | Disposition: A | Discharge status: Home / Self Care | Payer: BLUE CROSS/BLUE SHIELD | Source: Ambulatory Visit | Attending: Internal Medicine | Admitting: Internal Medicine

## 2015-12-22 ENCOUNTER — Other Ambulatory Visit: Payer: Self-pay | Admitting: Internal Medicine

## 2015-12-22 DIAGNOSIS — R059 Cough, unspecified: Secondary | ICD-10-CM

## 2015-12-22 DIAGNOSIS — M79662 Pain in left lower leg: Secondary | ICD-10-CM | POA: Diagnosis not present

## 2015-12-22 DIAGNOSIS — M7989 Other specified soft tissue disorders: Secondary | ICD-10-CM | POA: Diagnosis not present

## 2015-12-22 DIAGNOSIS — M79661 Pain in right lower leg: Secondary | ICD-10-CM

## 2015-12-22 DIAGNOSIS — R05 Cough: Secondary | ICD-10-CM | POA: Diagnosis not present

## 2015-12-22 DIAGNOSIS — R0602 Shortness of breath: Secondary | ICD-10-CM | POA: Diagnosis not present

## 2015-12-29 DIAGNOSIS — Z1211 Encounter for screening for malignant neoplasm of colon: Secondary | ICD-10-CM | POA: Diagnosis not present

## 2016-02-02 ENCOUNTER — Encounter: Payer: Self-pay | Admitting: Pulmonary Disease

## 2016-02-02 ENCOUNTER — Ambulatory Visit (INDEPENDENT_AMBULATORY_CARE_PROVIDER_SITE_OTHER): Payer: BLUE CROSS/BLUE SHIELD | Admitting: Pulmonary Disease

## 2016-02-02 VITALS — BP 118/74 | HR 74 | Temp 98.3°F | Ht 66.0 in | Wt 140.4 lb

## 2016-02-02 DIAGNOSIS — R0602 Shortness of breath: Secondary | ICD-10-CM | POA: Diagnosis not present

## 2016-02-02 DIAGNOSIS — R0600 Dyspnea, unspecified: Secondary | ICD-10-CM | POA: Diagnosis not present

## 2016-02-02 NOTE — Patient Instructions (Addendum)
It is nice to meet you today. We will schedule you for Pulmonary Function Tests today before you leave the office. Follow up appointment after PFT in 1 month. Please contact office for sooner follow up if symptoms do not improve or worsen or seek emergency care  .

## 2016-02-02 NOTE — Progress Notes (Signed)
History of Present Illness Donna King is a 60 y.o. female  Never smoker who is a retired Chief Financial Officer here for consultation for new radiographic diagnosis of COPD.Significant history includes HTN and resting tachycardia.   7/11/2017Consultation:  Pt. Presents today to re-establish herself with the practice as she was last seen in 2006. She was seen in the past by Dr. Sherene Sires for cough. She did have a bronch in 2006  which was abnormal. She states that she had a large amount of mucus and per the bronch results was sent to [facility] for evaluation of Primary Ciliary disease. She was evaluated and was told she did not have Primary Ciliary disease, she was worked up for NON-TB MAC which was also negative.She did find at that time, however that she got significant relief of her symptoms of cough with mucus production with use of Advair. She has had recurrent episodes of cough, fever, and shortness of breath in 2014, and again in 2017. Both were treated with Levaquin.Again she used her Advair with relief. She cannot tell if there is a trigger or factors that make it better or worse. She states in general she does have episodes about twice a year where she has large amounts of green sputum . Additional history includes resting tachycardia, and hypertension since the age of 9.She has been on Beta Blockers for about 10 years. 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 She states that the room was not well ventilated. She is currently denying chest pain, fever, cough, purulent secretions,orthopnea or hemoptysis. No leg or calf pain.  Tests: 12/22/2015 IMPRESSION: COPD. There is no pneumonia, CHF, nor other acute cardiopulmonary Abnormality   CT w/ contrast 07/22/2004 IMPRESSION: There is nonspecific scattered interstitial nodularity in both lower lobes with mild central airway  thickening, but no bronchiectasis. The findings are nonspecific, and could be secondary to prior infection, hypersensitivity pneumonitis, vasculitis, or other inflammatory process. There is no dominant lung nodule or confluent air space opacity, and no pleural effusion or adenopathy is demonstrated. CT follow-up may be helpful to assess stability.   Past medical hx Past Medical History  Diagnosis Date  . RAD (reactive airway disease)   . HTN (hypertension)   . Essential (primary) hypertension   . Asthma      Current Outpatient Prescriptions on File Prior to Visit  Medication Sig  . augmented betamethasone dipropionate (DIPROLENE-AF) 0.05 % cream Apply 1 application topically at bedtime.  . Cholecalciferol (VITAMIN D) 2000 units tablet Take 2,000 Units by mouth daily.  Marland Kitchen diltiazem (CARDIZEM CD) 120 MG 24 hr capsule Take 1 capsule (120 mg total) by mouth daily.  Marland Kitchen estradiol (VIVELLE-DOT) 0.1 MG/24HR patch Place 1 patch onto the skin 2 (two) times a week.  . metFORMIN (GLUMETZA) 500 MG (MOD) 24 hr tablet Take 1,000 mg by mouth daily with breakfast.  . mometasone (ASMANEX) 220 MCG/INH inhaler Inhale 2 puffs into the lungs daily as needed.  . nebivolol (BYSTOLIC) 5 MG tablet Take 5 mg by mouth daily.   No current facility-administered medications on file prior to visit.     Allergies  Allergen Reactions  . Blue Dyes (Parenteral) Rash and Other (See Comments)    Pt states she cannot be prescribed cardizem 360 mg because it comes with blue dye on this, and she is allergic to blue dye  .  Sulfa Antibiotics Other (See Comments)    Neuropathy   Past Surgical History  Procedure Laterality Date  . Laparoscopic hysterectomy    . Breast biopsy      x3   Review Of Systems:  Constitutional:   No  weight loss, night sweats,  Fevers, chills, fatigue, or  lassitude.  HEENT:   No headaches,  Difficulty swallowing,  Tooth/dental problems, or  Sore throat,                No sneezing, itching,  ear ache, nasal congestion, post nasal drip,   CV:  No chest pain,  Orthopnea, PND, swelling in lower extremities, anasarca, dizziness, palpitations, syncope.   GI  No heartburn, indigestion, abdominal pain, nausea, vomiting, diarrhea, change in bowel habits, loss of appetite, bloody stools.   Resp: No shortness of breath with exertion or  at rest , no  excess mucus, no productive cough,  No non-productive cough,  No coughing up of blood.  No change in color of mucus.  No wheezing.  No chest wall deformity  Skin: no rash or lesions.  GU: no dysuria, change in color of urine, no urgency or frequency.  No flank pain, no hematuria   MS:  No joint pain or swelling.  No decreased range of motion.  No back pain.  Psych:  No change in mood or affect. No depression or anxiety.  No memory loss.  Social History   Social History  . Marital Status: Married    Spouse Name: N/A  . Number of Children: N/A  . Years of Education: N/A   Occupational History  . Not on file.   Social History Main Topics  . Smoking status: Never Smoker   . Smokeless tobacco: Never Used  . Alcohol Use: 4.2 oz/week    7 Standard drinks or equivalent per week  . Drug Use: No  . Sexual Activity:    Partners: Male   Other Topics Concern  . Not on file   Social History Narrative    Family History  Problem Relation Age of Onset  . Thyroid disease Mother   . Heart disease Father   . Hypertension Sister   . Cancer Maternal Grandmother     pancrease     Vital Signs BP 118/74 mmHg  Pulse 74  Temp(Src) 98.3 F (36.8 C) (Oral)  Ht 5\' 6"  (1.676 m)  Wt 140 lb 6.4 oz (63.685 kg)  BMI 22.67 kg/m2  SpO2 98%   Physical Exam:  General- No distress,  A&Ox3, pleasant ENT: No sinus tenderness, TM clear, pale nasal mucosa, no oral exudate,no post nasal drip, no LAN Cardiac: S1, S2, regular rate and rhythm, no murmur Chest: No wheeze/ rales/ dullness; no accessory muscle use, no nasal flaring, no sternal  retractions Abd.: Soft Non-tender Ext: No clubbing cyanosis, edema Neuro:  normal strength Skin: No rashes, warm and dry Psych: normal mood and behavior   Assessment/Plan  Dyspnea  History of Dyspnea on and off since 2006. Recent CXR with hyperinflation/ suspicious for COPD Plan: We will schedule you for Pulmonary Function Tests today before you leave the office. Follow up appointment after PFT in 1 month. Please contact office for sooner follow up if symptoms do not improve or worsen or seek emergency care       Bevelyn NgoSarah F Groce, NP 02/02/2016  4:37 PM  Attending note: I have seen and examined the patient with nurse practitioner/resident and agree with the note. History, labs and imaging reviewed.  60 Y/O with recurrent attacks of cough, dyspnea. Noted to have a negative eval in past for primary ciliary dyskinesia, MAI CXR is suggestive of hyperinflation. She had a CT chest in 2005 that showed some pulmonary nodularity but a repeat CT scan at [facility] was normal.  We discussed getting a CT chest, A1AT levels and trying advair but decided to wait for the results of the PFTs before deciding on the next step.  Chilton Greathouse MD  Pulmonary and Critical Care Pager [phone] If no answer or after 3pm call: [phone] 02/02/2016, 5:20 PM

## 2016-02-02 NOTE — Assessment & Plan Note (Signed)
  History of Dyspnea on and off since 2006. Recent CXR with hyperinflation/ suspicious for COPD Plan: We will schedule you for Pulmonary Function Tests today before you leave the office. Follow up appointment after PFT in 1 month. Please contact office for sooner follow up if symptoms do not improve or worsen or seek emergency care

## 2016-02-04 ENCOUNTER — Ambulatory Visit (INDEPENDENT_AMBULATORY_CARE_PROVIDER_SITE_OTHER): Payer: BLUE CROSS/BLUE SHIELD | Admitting: Pulmonary Disease

## 2016-02-04 DIAGNOSIS — R0602 Shortness of breath: Secondary | ICD-10-CM

## 2016-02-04 LAB — PULMONARY FUNCTION TEST
DL/VA % pred: 73 %
DL/VA: 3.72 ml/min/mmHg/L
DLCO COR % PRED: 66 %
DLCO cor: 17.99 ml/min/mmHg
DLCO unc % pred: 70 %
DLCO unc: 18.91 ml/min/mmHg
FEF 25-75 POST: 1.47 L/s
FEF 25-75 Pre: 1.44 L/sec
FEF2575-%Change-Post: 2 %
FEF2575-%PRED-POST: 59 %
FEF2575-%Pred-Pre: 58 %
FEV1-%CHANGE-POST: 0 %
FEV1-%Pred-Post: 89 %
FEV1-%Pred-Pre: 89 %
FEV1-Post: 2.47 L
FEV1-Pre: 2.45 L
FEV1FVC-%Change-Post: 1 %
FEV1FVC-%PRED-PRE: 89 %
FEV6-%Change-Post: 0 %
FEV6-%Pred-Post: 100 %
FEV6-%Pred-Pre: 100 %
FEV6-POST: 3.46 L
FEV6-PRE: 3.44 L
FEV6FVC-%Change-Post: 1 %
FEV6FVC-%PRED-POST: 101 %
FEV6FVC-%PRED-PRE: 100 %
FVC-%CHANGE-POST: 0 %
FVC-%PRED-POST: 98 %
FVC-%PRED-PRE: 99 %
FVC-POST: 3.5 L
FVC-PRE: 3.52 L
PRE FEV6/FVC RATIO: 98 %
Post FEV1/FVC ratio: 71 %
Post FEV6/FVC ratio: 99 %
Pre FEV1/FVC ratio: 70 %
RV % PRED: 529 %
RV: 11.07 L
TLC % PRED: 266 %
TLC: 14.24 L

## 2016-02-04 NOTE — Progress Notes (Signed)
PFT done today. 

## 2016-02-10 ENCOUNTER — Encounter: Payer: Self-pay | Admitting: Pulmonary Disease

## 2016-02-10 ENCOUNTER — Ambulatory Visit (INDEPENDENT_AMBULATORY_CARE_PROVIDER_SITE_OTHER): Payer: BLUE CROSS/BLUE SHIELD | Admitting: Pulmonary Disease

## 2016-02-10 VITALS — BP 106/64 | HR 62 | Ht 66.0 in | Wt 141.0 lb

## 2016-02-10 DIAGNOSIS — R0602 Shortness of breath: Secondary | ICD-10-CM

## 2016-02-10 DIAGNOSIS — R0600 Dyspnea, unspecified: Secondary | ICD-10-CM | POA: Diagnosis not present

## 2016-02-10 MED ORDER — LEVOFLOXACIN 750 MG PO TABS: 750.0000 mg | ORAL_TABLET | Freq: Every day | ORAL | Status: DC

## 2016-02-10 NOTE — Patient Instructions (Addendum)
We will send you for AI antitypsin levels and phenotype I will send in a prescription for levaquin 750 mg/day for 5 days to be used in case of exacerbation I will prescribe asmanex 220 mcg 1 puff twice daily.  Return in 6 months.

## 2016-02-10 NOTE — Progress Notes (Signed)
Subjective:    Patient ID: Alison MurrayCynthia P Snider, female    DOB: 1955-10-14, 60 y.o.   MRN: [id number]  HPI  Consult for evaluation of abnormal CXR  Mrs. 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 She was seen in 2006 for cough by Dr. Sherene SiresWert. She had a Bronch in 2006 which showed abnormal mucus. [facility]. She was evaluated and was told she did not have Primary Ciliary disease, she was worked up for NON-TB MAC which was also negative.  She got significant relief of her symptoms of cough with mucus production with use of Advair. She has had recurrent episodes of cough, fever, and shortness of breath in 2014, and again in 2017. Both were treated with Levaquin. Again she used her Advair with relief. She cannot tell if there is a trigger or factors that make it better or worse. She states in general she does have episodes about twice a year where she has large amounts of green sputum . Additional history includes resting tachycardia, and hypertension since the age of 60.She has been on Beta Blockers for about 10 years. 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 She states that the room was not well ventilated. She is currently denying chest pain, fever, cough, purulent secretions,orthopnea or hemoptysis. No leg or calf pain.  DATA: PFTs 02/04/16 FVC 2.5 to [99%) FEV1 2.45 (89%) F/F 70 TLC 5.35 [94%] RV/TLC 122% DLCO 70% Minimal obstructive disease, hyperinflation and air trapping.  CXR 12/22/15 Hyperinflation  Social history: She is a never smoker, occasional alcohol use no illegal drug use.  Family History: Heart disease-father Hypotension-sister Thyroid disease-mother  Past Medical History  Diagnosis Date  . RAD (reactive airway disease)    . HTN (hypertension)   . Essential (primary) hypertension   . Asthma     Current outpatient prescriptions:  .  augmented betamethasone dipropionate (DIPROLENE-AF) 0.05 % cream, Apply 1 application topically at bedtime., Disp: , Rfl:  .  Cholecalciferol (VITAMIN D) 2000 units tablet, Take 2,000 Units by mouth daily., Disp: , Rfl:  .  diltiazem (CARDIZEM CD) 120 MG 24 hr capsule, Take 1 capsule (120 mg total) by mouth daily., Disp: 90 capsule, Rfl: 3 .  estradiol (VIVELLE-DOT) 0.1 MG/24HR patch, Place 1 patch onto the skin 2 (two) times a week., Disp: , Rfl:  .  metFORMIN (GLUMETZA) 500 MG (MOD) 24 hr tablet, Take 1,000 mg by mouth daily with breakfast., Disp: , Rfl:  .  mometasone (ASMANEX) 220 MCG/INH inhaler, Inhale 2 puffs into the lungs 2 (two) times daily. , Disp: , Rfl:  .  nebivolol (BYSTOLIC) 5 MG tablet, Take 5 mg by mouth daily., Disp: , Rfl:  .  levofloxacin (LEVAQUIN) 750 MG tablet, Take 1 tablet (750 mg total) by mouth daily., Disp: 5 tablet, Rfl: 0  Review of Systems Denies any cough, dyspnea, wheezing, sputum production. No chest pain, palpitation. No fevers, chills, loss of weight, loss of appetite. No nausea, vomiting, diarrhea, constipation. All other review of systems negative.    Objective:   Physical Exam Blood pressure 106/64, pulse 62, height 5\' 6"  (1.676 m), weight 141 lb (63.957 kg), SpO2 96 %. Gen: No apparent distress Neuro: No gross focal deficits. HEENT: No JVD, lymphadenopathy, thyromegaly. RS: Clear, No wheeze  or crackles CVS: S1-S2 heard, no murmurs rubs gallops. Abdomen: Soft, positive bowel sounds. Musculoskeletal: No edema.    Assessment & Plan:  Mild Emphysema Asthmatic bronchitis  CXR x-ray and PFTs were reviewed with her today. They show mild obstruction consistent with emphysema. She has impressive degree of hyperinflation and air trapping. She is convinced that exposure to formaldehyde in medical school caused her emphysema, which [place]  unusual but possible. She has recurrent bronchitis with an asthmatic component as well.  I discussed putting her back on Advair on a continuous basis. She preferred to be on an inhaled steroid for now and will continue her albuterol rescue inhaler.  Plan: - Start asmanex, albuterol rescue inhaler   Return in 6 months.  Chilton Greathouse MD [place] [facility] Pager [phone] If no answer or after 3pm call: [phone] 02/10/2016, 2:28 PM

## 2016-02-15 DIAGNOSIS — K635 Polyp of colon: Secondary | ICD-10-CM | POA: Diagnosis not present

## 2016-02-15 DIAGNOSIS — D120 Benign neoplasm of cecum: Secondary | ICD-10-CM | POA: Diagnosis not present

## 2016-02-15 DIAGNOSIS — Z1211 Encounter for screening for malignant neoplasm of colon: Secondary | ICD-10-CM | POA: Diagnosis not present

## 2016-02-23 ENCOUNTER — Encounter: Payer: Self-pay | Admitting: Internal Medicine

## 2016-05-03 DIAGNOSIS — Z23 Encounter for immunization: Secondary | ICD-10-CM | POA: Diagnosis not present

## 2016-06-20 ENCOUNTER — Other Ambulatory Visit: Payer: Self-pay | Admitting: Obstetrics and Gynecology

## 2016-06-20 DIAGNOSIS — Z1231 Encounter for screening mammogram for malignant neoplasm of breast: Secondary | ICD-10-CM

## 2016-07-07 DIAGNOSIS — H25813 Combined forms of age-related cataract, bilateral: Secondary | ICD-10-CM | POA: Diagnosis not present

## 2016-08-08 DIAGNOSIS — I10 Essential (primary) hypertension: Secondary | ICD-10-CM | POA: Diagnosis not present

## 2016-08-08 DIAGNOSIS — E559 Vitamin D deficiency, unspecified: Secondary | ICD-10-CM | POA: Diagnosis not present

## 2016-08-08 DIAGNOSIS — Z0000 Encounter for general adult medical examination without abnormal findings: Secondary | ICD-10-CM | POA: Diagnosis not present

## 2016-08-08 DIAGNOSIS — Z79899 Other long term (current) drug therapy: Secondary | ICD-10-CM | POA: Diagnosis not present

## 2016-08-08 DIAGNOSIS — I471 Supraventricular tachycardia: Secondary | ICD-10-CM | POA: Diagnosis not present

## 2016-08-08 DIAGNOSIS — J449 Chronic obstructive pulmonary disease, unspecified: Secondary | ICD-10-CM | POA: Diagnosis not present

## 2016-08-08 DIAGNOSIS — J3489 Other specified disorders of nose and nasal sinuses: Secondary | ICD-10-CM | POA: Diagnosis not present

## 2016-08-08 DIAGNOSIS — J4520 Mild intermittent asthma, uncomplicated: Secondary | ICD-10-CM | POA: Diagnosis not present

## 2016-08-11 ENCOUNTER — Ambulatory Visit: Payer: BLUE CROSS/BLUE SHIELD

## 2016-09-08 ENCOUNTER — Ambulatory Visit
Admission: RE | Admit: 2016-09-08 | Discharge: 2016-09-08 | Disposition: A | Discharge status: Home / Self Care | Payer: BLUE CROSS/BLUE SHIELD | Source: Ambulatory Visit | Attending: Obstetrics and Gynecology | Admitting: Obstetrics and Gynecology

## 2016-09-08 DIAGNOSIS — Z1231 Encounter for screening mammogram for malignant neoplasm of breast: Secondary | ICD-10-CM

## 2016-10-11 DIAGNOSIS — L814 Other melanin hyperpigmentation: Secondary | ICD-10-CM | POA: Diagnosis not present

## 2016-10-11 DIAGNOSIS — D2271 Melanocytic nevi of right lower limb, including hip: Secondary | ICD-10-CM | POA: Diagnosis not present

## 2016-10-11 DIAGNOSIS — L570 Actinic keratosis: Secondary | ICD-10-CM | POA: Diagnosis not present

## 2016-10-11 DIAGNOSIS — D225 Melanocytic nevi of trunk: Secondary | ICD-10-CM | POA: Diagnosis not present

## 2016-10-11 DIAGNOSIS — D1800 Hemangioma unspecified site: Secondary | ICD-10-CM | POA: Diagnosis not present

## 2016-10-13 ENCOUNTER — Ambulatory Visit (INDEPENDENT_AMBULATORY_CARE_PROVIDER_SITE_OTHER): Payer: BLUE CROSS/BLUE SHIELD | Admitting: Obstetrics and Gynecology

## 2016-10-13 ENCOUNTER — Encounter: Payer: Self-pay | Admitting: Obstetrics and Gynecology

## 2016-10-13 VITALS — BP 124/70 | HR 68 | Resp 14 | Ht 66.0 in | Wt 145.0 lb

## 2016-10-13 DIAGNOSIS — Z79890 Hormone replacement therapy (postmenopausal): Secondary | ICD-10-CM | POA: Diagnosis not present

## 2016-10-13 DIAGNOSIS — R3129 Other microscopic hematuria: Secondary | ICD-10-CM | POA: Diagnosis not present

## 2016-10-13 DIAGNOSIS — Z5181 Encounter for therapeutic drug level monitoring: Secondary | ICD-10-CM | POA: Diagnosis not present

## 2016-10-13 DIAGNOSIS — E559 Vitamin D deficiency, unspecified: Secondary | ICD-10-CM | POA: Diagnosis not present

## 2016-10-13 DIAGNOSIS — Z01419 Encounter for gynecological examination (general) (routine) without abnormal findings: Secondary | ICD-10-CM | POA: Diagnosis not present

## 2016-10-13 LAB — POCT URINALYSIS DIPSTICK
Bilirubin, UA: NEGATIVE
Glucose, UA: NEGATIVE
KETONES UA: NEGATIVE
LEUKOCYTES UA: NEGATIVE
Nitrite, UA: NEGATIVE
PH UA: 6.5 (ref 5.0–8.0)
PROTEIN UA: NEGATIVE
UROBILINOGEN UA: NEGATIVE (ref ?–2.0)

## 2016-10-13 NOTE — Progress Notes (Signed)
61 y.o. [id number] MarriedCaucasianF here for annual exam.  The patient has a h/o a TLH/BS. No vaginal bleeding. She is on 0.075 mg, wants to stay on this dose, vasomotor symptoms are tolerable on this dose. Didn't like the lower dose.  She is on metformin for lichen planus She recently increased her vit d from 1000 IU to 2000 IU, had a low vit d with her primary (low 30's).     No LMP recorded. Patient has had a hysterectomy.          Sexually active: Yes.    The current method of family planning is status post hysterectomy.    Exercising: Yes.    gym, trainer, dance, weights Smoker:  no  Health Maintenance: Pap:  2010 - Hysterectomy  History of abnormal Pap:  no MMG:  09-08-16 WNL Colonoscopy:  2017 polyps removed-repeat in 3-5 yrs  BMD: unsure  TDaP:  2011  Gardasil: N/A   reports that she has never smoked. She has never used smokeless tobacco. She reports that she drinks about 4.2 oz of alcohol per week . She reports that she does not use drugs.Retired Education officer, environmentalGYN. Kids are 33 and 30.   Past Medical History:  Diagnosis Date  . Asthma   . Essential (primary) hypertension   . HTN (hypertension)   . RAD (reactive airway disease)     Past Surgical History:  Procedure Laterality Date  . BREAST BIOPSY     x3  . LAPAROSCOPIC HYSTERECTOMY      Current Outpatient Prescriptions  Medication Sig Dispense Refill  . Cholecalciferol (VITAMIN D) 2000 units tablet Take 2,000 Units by mouth daily.    Marland Kitchen. diltiazem (CARDIZEM CD) 120 MG 24 hr capsule Take 1 capsule (120 mg total) by mouth daily. 90 capsule 3  . estradiol (VIVELLE-DOT) 0.075 MG/24HR Place 1 patch onto the skin 2 (two) times a week.    . metFORMIN (GLUMETZA) 500 MG (MOD) 24 hr tablet Take 1,000 mg by mouth daily with breakfast.    . mometasone (ASMANEX) 220 MCG/INH inhaler Inhale 2 puffs into the lungs 2 (two) times daily.     . nebivolol (BYSTOLIC) 5 MG tablet Take 5 mg by mouth daily.     No current facility-administered medications  for this visit.     Family History  Problem Relation Age of Onset  . Thyroid disease Mother   . Heart disease Father   . Hypertension Sister   . Cancer Maternal Grandmother     pancrease    Review of Systems  Constitutional: Negative.   HENT: Negative.   Eyes: Negative.   Respiratory: Negative.   Cardiovascular: Negative.   Gastrointestinal: Negative.   Endocrine: Negative.   Genitourinary: Negative.   Musculoskeletal: Negative.   Skin: Negative.   Allergic/Immunologic: Negative.   Neurological: Negative.   Psychiatric/Behavioral: Negative.     Exam:   BP 124/70 (BP Location: Right Arm, Patient Position: Sitting, Cuff Size: Normal)   Pulse 68   Resp 14   Ht 5\' 6"  (1.676 m)   Wt 145 lb (65.8 kg)   BMI 23.40 kg/m   Weight change: @WEIGHTCHANGE @ Height:   Height: 5\' 6"  (167.6 cm)  Ht Readings from Last 3 Encounters:  10/13/16 5\' 6"  (1.676 m)  02/10/16 5\' 6"  (1.676 m)  02/02/16 5\' 6"  (1.676 m)    General appearance: alert, cooperative and appears stated age Head: Normocephalic, without obvious abnormality, atraumatic Neck: no adenopathy, supple, symmetrical, trachea midline and thyroid normal to inspection and  palpation Lungs: clear to auscultation bilaterally Cardiovascular: regular rate and rhythm Breasts: normal appearance, no masses or tenderness, bilateral implants Heart: regular rate and rhythm Abdomen: soft, non-tender; bowel sounds normal; no masses,  no organomegaly Extremities: extremities normal, atraumatic, no cyanosis or edema Skin: Skin color, texture, turgor normal. No rashes or lesions Lymph nodes: Cervical, supraclavicular, and axillary nodes normal. No abnormal inguinal nodes palpated Neurologic: Grossly normal   Pelvic: External genitalia:  no lesions              Urethra:  normal appearing urethra with no masses, tenderness or lesions              Bartholins and Skenes: normal                 Vagina: normal appearing vagina with normal color  and discharge, no lesions              Cervix: absent               Bimanual Exam:  Uterus:  uterus absent              Adnexa: no mass, fullness, tenderness               Rectovaginal: Confirms               Anus:  normal sphincter tone, no lesions  Chaperone was present for exam.  A:  Well Woman with normal exam  H/O multiple benign breast biopsies  Microscopic hematuria   H/O vit d def, she increased her vit d 6 weeks ago  P:   No pap needed  Mammogram UTD  Did the Claris Gladden model for breast cancer. Her lifetime risk os 14.6%  She understands MRI's are recommended for risks over 20%  She may choose to have a MRI in the future  She is taking 0.075 mg Vivelle dot (doesn't need a script)

## 2016-10-13 NOTE — Patient Instructions (Signed)

## 2016-10-14 LAB — URINALYSIS, MICROSCOPIC ONLY
BACTERIA UA: NONE SEEN [HPF]
CASTS: NONE SEEN [LPF]
CRYSTALS: NONE SEEN [HPF]
RBC / HPF: NONE SEEN RBC/HPF (ref <=2)
Yeast: NONE SEEN [HPF]

## 2016-11-21 ENCOUNTER — Other Ambulatory Visit: Payer: Self-pay

## 2016-11-21 NOTE — Telephone Encounter (Signed)
Received medication refill request from [facility] on [address] for Levofloxacin 750 #5. Pt's last OV with PM was 02/10/16, and pt as instructed to return in 21mo. Rx has been denied, as pt will need to contact our office first. Nothing further needed.

## 2016-11-29 ENCOUNTER — Telehealth: Payer: Self-pay

## 2016-11-29 NOTE — Telephone Encounter (Signed)
We have received 6 Rx refill request for Levofloxacin 750mg  from 11/24/16 to 11/28/16. I have spoken with pt, who states she is not currently having any symptoms. Pt states at her last OV with PM on 02/09/17, PM gave Rx for levaquin 750 mg/day for 5 days to be used in case of exacerbation. Pt states she used this Rx two weeks ago for an exacerbation. Pt states [place] is going [place] on [place], and would like to have an Rx to have on hand in case of an exacerbation.  PM please advise on RX refill. Thanks.

## 2016-11-30 MED ORDER — LEVOFLOXACIN 750 MG PO TABS: 750.0000 mg | ORAL_TABLET | Freq: Every day | ORAL | 0 refills | Status: DC

## 2016-11-30 NOTE — Telephone Encounter (Signed)
Ok to refill 

## 2016-11-30 NOTE — Telephone Encounter (Signed)
Rx has been sent to preferred pharmacy.  Pt is aware and voiced her understanding. Nothing further needed.  

## 2017-05-11 DIAGNOSIS — Z23 Encounter for immunization: Secondary | ICD-10-CM | POA: Diagnosis not present

## 2017-05-11 DIAGNOSIS — L650 Telogen effluvium: Secondary | ICD-10-CM | POA: Diagnosis not present

## 2017-05-11 DIAGNOSIS — Z79899 Other long term (current) drug therapy: Secondary | ICD-10-CM | POA: Diagnosis not present

## 2017-06-09 DIAGNOSIS — J449 Chronic obstructive pulmonary disease, unspecified: Secondary | ICD-10-CM | POA: Insufficient documentation

## 2017-06-09 DIAGNOSIS — J180 Bronchopneumonia, unspecified organism: Secondary | ICD-10-CM | POA: Diagnosis not present

## 2017-06-13 ENCOUNTER — Telehealth: Payer: Self-pay | Admitting: Pulmonary Disease

## 2017-06-13 DIAGNOSIS — J449 Chronic obstructive pulmonary disease, unspecified: Secondary | ICD-10-CM | POA: Diagnosis not present

## 2017-06-13 DIAGNOSIS — D510 Vitamin B12 deficiency anemia due to intrinsic factor deficiency: Secondary | ICD-10-CM | POA: Diagnosis not present

## 2017-06-13 DIAGNOSIS — R5383 Other fatigue: Secondary | ICD-10-CM | POA: Diagnosis not present

## 2017-06-13 DIAGNOSIS — J189 Pneumonia, unspecified organism: Secondary | ICD-10-CM | POA: Diagnosis not present

## 2017-06-13 DIAGNOSIS — D72829 Elevated white blood cell count, unspecified: Secondary | ICD-10-CM | POA: Diagnosis not present

## 2017-06-13 NOTE — Telephone Encounter (Signed)
Left patient a message stating that we had received her request.   Patient was last seen by PM in 2017 for dyspnea. Has not followed up since then.   PM, please advise if you are ok with her switching over to MR.   MR, please advise if you are ok with taking her as a new patient.   Thanks!

## 2017-06-13 NOTE — Telephone Encounter (Signed)
OK with me.

## 2017-06-14 ENCOUNTER — Telehealth: Payer: Self-pay | Admitting: Pulmonary Disease

## 2017-06-14 NOTE — Telephone Encounter (Signed)
Pt returning call.  She says someone called her late yesterday afternoon.-tr

## 2017-06-14 NOTE — Telephone Encounter (Signed)
Previous note:  Patient is requesting to switch providers from Dr. Isaiah SergeMannam to Dr. Marchelle Gearingamaswamy. She was advised of office protocol.    Chilton GreathouseMannam, Praveen, MD      06/13/17 4:35 PM  Note    OK with me      MR ok to take over pt's care

## 2017-06-19 DIAGNOSIS — H9311 Tinnitus, right ear: Secondary | ICD-10-CM | POA: Diagnosis not present

## 2017-06-19 DIAGNOSIS — H903 Sensorineural hearing loss, bilateral: Secondary | ICD-10-CM | POA: Diagnosis not present

## 2017-06-19 DIAGNOSIS — H9041 Sensorineural hearing loss, unilateral, right ear, with unrestricted hearing on the contralateral side: Secondary | ICD-10-CM | POA: Diagnosis not present

## 2017-06-19 DIAGNOSIS — H8141 Vertigo of central origin, right ear: Secondary | ICD-10-CM | POA: Diagnosis not present

## 2017-06-19 DIAGNOSIS — H6521 Chronic serous otitis media, right ear: Secondary | ICD-10-CM | POA: Diagnosis not present

## 2017-06-19 DIAGNOSIS — R42 Dizziness and giddiness: Secondary | ICD-10-CM | POA: Diagnosis not present

## 2017-06-19 NOTE — Telephone Encounter (Signed)
First available 30 min slot.  Fine for switch to myself (MR)  Dr. Kalman ShanMurali Imari Sivertsen, M.D., [facility] Pulmonary and Critical Care Medicine Staff Physician, [facility] Center Director - Interstitial Lung Disease  Program  Pulmonary Fibrosis [facility] at [facility] [place], [id number]  Pager: [phone], If no answer or between  15:00h - 7:00h: call [phone] Telephone: [phone]

## 2017-06-19 NOTE — Telephone Encounter (Signed)
Spoke with patient. She has been scheduled with MR for 07/11/17 at 345. Patient is aware of appt. Nothing else needed at time of call.

## 2017-06-29 DIAGNOSIS — D72829 Elevated white blood cell count, unspecified: Secondary | ICD-10-CM | POA: Diagnosis not present

## 2017-06-29 DIAGNOSIS — R5383 Other fatigue: Secondary | ICD-10-CM | POA: Diagnosis not present

## 2017-07-11 ENCOUNTER — Institutional Professional Consult (permissible substitution): Payer: BLUE CROSS/BLUE SHIELD | Admitting: Internal Medicine

## 2017-07-21 DIAGNOSIS — H2513 Age-related nuclear cataract, bilateral: Secondary | ICD-10-CM | POA: Diagnosis not present

## 2017-08-03 ENCOUNTER — Ambulatory Visit (INDEPENDENT_AMBULATORY_CARE_PROVIDER_SITE_OTHER): Payer: BLUE CROSS/BLUE SHIELD | Admitting: Internal Medicine

## 2017-08-03 ENCOUNTER — Encounter: Payer: Self-pay | Admitting: Internal Medicine

## 2017-08-03 VITALS — BP 112/70 | HR 74 | Ht 66.0 in | Wt 136.8 lb

## 2017-08-03 DIAGNOSIS — J449 Chronic obstructive pulmonary disease, unspecified: Secondary | ICD-10-CM | POA: Diagnosis not present

## 2017-08-03 DIAGNOSIS — J683 Other acute and subacute respiratory conditions due to chemicals, gases, fumes and vapors: Secondary | ICD-10-CM

## 2017-08-03 MED ORDER — PREDNISONE 10 MG PO TABS: ORAL_TABLET | ORAL | 0 refills | Status: DC

## 2017-08-03 MED ORDER — ACLIDINIUM BROMIDE 400 MCG/ACT IN AEPB: 1.0000 | INHALATION_SPRAY | Freq: Two times a day (BID) | RESPIRATORY_TRACT | 0 refills | Status: DC

## 2017-08-03 MED ORDER — DOXYCYCLINE HYCLATE 100 MG PO TABS: 100.0000 mg | ORAL_TABLET | Freq: Two times a day (BID) | ORAL | 2 refills | Status: DC

## 2017-08-03 NOTE — Progress Notes (Signed)
Subjective:     Patient ID: Donna King, female   DOB: 1956-01-24, 62 y.o.   MRN: [id number]  HPI HPI  Consult for evaluation of abnormal CXR -  July 2017  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 She was seen in 2006 for cough by Dr. Melvyn Novas. She had a Bronch in 2006 which showed abnormal mucus. She was sent to [facility] for evaluation of Primary Ciliary disease. She was evaluated and was told she did not have Primary Ciliary disease, she was worked up for NON-TB MAC which was also negative.  She got significant relief of her symptoms of cough with mucus production with use of Advair. She has had recurrent episodes of cough, fever, and shortness of breath in 2014, and again in 2017. Both were treated with Levaquin. Again she used her Advair with relief. She cannot tell if there is a trigger or factors that make it better or worse. She states in general she does have episodes about twice a year where she has large amounts of green sputum . Additional history includes resting tachycardia, and hypertension since the age of 68.She has been on Beta Blockers for about 10 years. 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 She states that the room was not well ventilated. She is currently denying chest pain, fever, cough, purulent secretions,orthopnea or hemoptysis. No leg or calf pain.  DATA: PFTs 02/04/16 FVC 2.5 to [99%) FEV1 2.45 (89%) F/F 70 TLC 5.35 [94%] RV/TLC 122% DLCO 70% Minimal obstructive disease, hyperinflation and air trapping.  CXR 12/22/15 Hyperinflation    OV 08/03/2017  Chief Complaint  Patient presents with  . Advice Only    Switching from PM to MR for dyspnea.  Pt states that she has had pna for the fourth time, most recent dx was Thanksgiving. 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  She is here for history of recurrent pneumonia with COPD exacerbation.  She has never been a smoker.  She has a lifelong history of obstructive lung disease per history.  She tells me when she was in medical school at [employer] she was exposed to heavy formaldehyde for 1 or 2 months at the anatomy lab.  This then corresponded with acute sick visit to the student health clinic and being treated with prednisone.  She also noticed that every time she walked into the anatomy lab she would cough significantly.  After this approximately 30 years ago she picked up the first episode of pneumonia and there was no pneumonia except for the last 3 years where she has had 3 episodes of pneumonia one each year.  2 of these have been in the winter/fall.  1 of this was in the summer when she was in [place].  She says each of this is associated with fever and sputum and cough.  Most recently was in November 2018 during Thanksgiving when she took quite ill.  She went to urgent care she had a neutrophilia 91% she showed me the result.  She had a chest x-ray which apparently had infiltrates.  She was treated with Levaquin which worked.  She says that her life got disrupted for 1 month and it took a while to recover.  She travels  a lot and wants to be as fit as she can for these travels.  She wants to have preventative treatment but she is skeptical of inhaled steroids because of the side effects.  She tried anticholinergic Spiriva but this caused dryness and constipation so she is very if this.  She says she is conflicted between trying to prevent these exacerbations with an inhaler versus it causing side effects.  She also wants refills of resuscitative treatment in case she got sick.  She has a lot of questions about the etiology of her original illness and reasons for exacerbation.  Between exacerbations she is fine.  She says she only gets  dyspneic if she tries to do aerobic activity in a hurry.  She is a Software engineer with excellent functional capacity.  The last chest x-ray with [place] was in May 2017 that showed hyperinflation.  She had a chest X in November 2018 with infiltrates but I do not have this.  Most recent PFT was in 2017 that I visualized the images and the graph that shows isolated reduction in diffusion.  She had a CT scan of the chest in 2005 that I personally visualized the images it showed some basal infiltrate and hyperinflation.   has a past medical history of Asthma, Essential (primary) hypertension, HTN (hypertension), and RAD (reactive airway disease).   reports that  has never smoked. she has never used smokeless tobacco.  Past Surgical History:  Procedure Laterality Date  . BREAST BIOPSY     x3  . LAPAROSCOPIC HYSTERECTOMY      Allergies  Allergen Reactions  . Blue Dyes (Parenteral) Rash and Other (See Comments)    Pt states she cannot be prescribed cardizem 360 mg because it comes with blue dye on this, and she is allergic to blue dye  . Sulfa Antibiotics Other (See Comments)    Neuropathy    Immunization History  Administered Date(s) Administered  . Hepatitis B 07/25/1988  . Influenza Split 04/18/2013, 04/08/2017  . Influenza-Unspecified 04/22/2015  . Pneumococcal Polysaccharide-23 01/02/2014  . Td 07/25/2009    Family History  Problem Relation Age of Onset  . Thyroid disease Mother   . Heart disease Father   . Hypertension Sister   . Cancer Maternal Grandmother        pancrease     Current Outpatient Medications:  .  Cholecalciferol (VITAMIN D) 2000 units tablet, Take 2,000 Units by mouth daily., Disp: , Rfl:  .  diltiazem (CARDIZEM CD) 120 MG 24 hr capsule, Take 1 capsule (120 mg total) by mouth daily., Disp: 90 capsule, Rfl: 3 .  estradiol (VIVELLE-DOT) 0.075 MG/24HR, Place 1 patch onto the skin 2 (two) times a week., Disp: , Rfl:  .  ipratropium (ATROVENT) 0.03 % nasal spray,  ipratropium bromide 0.03 % nasal spray, Disp: , Rfl:  .  mometasone (ASMANEX) 220 MCG/INH inhaler, Inhale 2 puffs into the lungs 2 (two) times daily. , Disp: , Rfl:  .  nebivolol (BYSTOLIC) 5 MG tablet, Take 5 mg by mouth daily., Disp: , Rfl:  .  spironolactone (ALDACTONE) 100 MG tablet, spironolactone 100 mg tablet, Disp: , Rfl:  .  levofloxacin (LEVAQUIN) 750 MG tablet, Take 1 tablet (750 mg total) by mouth daily. (Patient not taking: Reported on 08/03/2017), Disp: 5 tablet, Rfl: 0 .  metFORMIN (GLUMETZA) 500 MG (MOD) 24 hr tablet, Take 1,000 mg by mouth daily with breakfast., Disp: , Rfl:    Review of Systems     Objective:  Physical Exam  Constitutional: She is oriented to person, place, and time. She appears well-developed and well-nourished. No distress.  HENT:  Head: Normocephalic and atraumatic.  Right Ear: External ear normal.  Left Ear: External ear normal.  Mouth/Throat: Oropharynx is clear and moist. No oropharyngeal exudate.  Eyes: Conjunctivae and EOM are normal. Pupils are equal, round, and reactive to light. Right eye exhibits no discharge. Left eye exhibits no discharge. No scleral icterus.  Neck: Normal range of motion. Neck supple. No JVD present. No tracheal deviation present. No thyromegaly present.  Cardiovascular: Normal rate, regular rhythm, normal heart sounds and intact distal pulses. Exam reveals no gallop and no friction rub.  No murmur heard. Pulmonary/Chest: Effort normal and breath sounds normal. No respiratory distress. She has no wheezes. She has no rales. She exhibits no tenderness.  Abdominal: Soft. Bowel sounds are normal. She exhibits no distension and no mass. There is no tenderness. There is no rebound and no guarding.  Musculoskeletal: Normal range of motion. She exhibits no edema or tenderness.  Lymphadenopathy:    She has no cervical adenopathy.  Neurological: She is alert and oriented to person, place, and time. She has normal reflexes. No  cranial nerve deficit. She exhibits normal muscle tone. Coordination normal.  Skin: Skin is warm and dry. No rash noted. She is not diaphoretic. No erythema. No pallor.  Psychiatric: She has a normal mood and affect. Her behavior is normal. Judgment and thought content normal.  Vitals reviewed.  Vitals:   08/03/17 0931  BP: 112/70  Pulse: 74  SpO2: 97%  Weight: 136 lb 12.8 oz (62.1 kg)  Height: _0  (1.676 m)    Estimated body mass index is 22.08 kg/m as calculated from the following:   Height as of this encounter: _1  (1.676 m).   Weight as of this encounter: 136 lb 12.8 oz (62.1 kg).     Assessment:       ICD-10-CM   1. 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 Chronic obstructive pulmonary disease, unspecified COPD type ([place]) J44.9    I think she ended up with reactive airway dysfunction syndrome following formaldehyde exposure when she was in medical school and since then is left with obstructive lung disease.  We had a long discussion    Plan:     Long discussion about preventative treatment.  We felt that anticholinergic was the simplest most effective measure but we did agree about side effects of constipation and dry mouth.  We discussed about trying an alternative to Spiriva.  We going to give her tudorza samples and see how it goes.  She still skeptical about the side effects.  Therefore given the fact 2 of these recent episodes of happened in the winter/fall and this is when exacerbations are more likely to happen we decided that we will only treated with an anticholinergic for 6 months in the fall in the winter.  She is agreeable to this plan.  In addition we discussed acute treatment and will give her refills of 5 days of doxycycline and prednisone to be kept handy in the pharmacy for her travel needs and if she got symptomatic.  She will try to bring a CD-ROM of most recent chest x-ray from a visualization  At next visit will get a repeat spirometry  with bronchodilator response and DLCO     > 50% of this > 40 min visit spent in face to face counseling or/and coordination of care  Dr. Brand Males, M.D., [facility].C.P Pulmonary and Critical Care Medicine Staff Physician, [place] Director - Interstitial Lung Disease  Program  [facility] [place] [facility] [place], [place], [address]  Pager: [phone], If no answer or between  15:00h - 7:00h: call [phone] Telephone: [phone]  ]

## 2017-08-03 NOTE — Patient Instructions (Addendum)
ICD-10-CM   1. 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     For future exacerbation - Please take 2 refills for   Take doxycycline 100mg  po twice daily x 5 days; take after meals and avoid sunlight  Please take prednisone 40 mg x1 day, then 30 mg x1 day, then 20 mg x1 day, then 10 mg x1 day, and then 5 mg x1 day and stop  Always take antibiotic/prednisone with you for tavel  To prevent exacerbations - General advice: avoiding sick people, flu shot every season,  - We agreed for you to try over fall/winter 6 months of an inhaled anticholinergic  - try samples of tudorza for 2 weeks and then incruse for 2 weeks   - hopefully no side effects    - will list spiriva as causing dryness and constipation  Please get CXR CD Rom done at urgent care in Nov 2018  FOllowup  in 3 months do Pre-bd spiro and post-bd and dlco only. No lung volume or bd response.  Return to see me in 3 months

## 2017-08-07 ENCOUNTER — Other Ambulatory Visit: Payer: Self-pay | Admitting: Obstetrics and Gynecology

## 2017-08-07 DIAGNOSIS — Z1231 Encounter for screening mammogram for malignant neoplasm of breast: Secondary | ICD-10-CM

## 2017-09-11 ENCOUNTER — Ambulatory Visit
Admission: RE | Admit: 2017-09-11 | Discharge: 2017-09-11 | Disposition: A | Discharge status: Home / Self Care | Payer: BLUE CROSS/BLUE SHIELD | Source: Ambulatory Visit | Attending: Obstetrics and Gynecology | Admitting: Obstetrics and Gynecology

## 2017-09-11 DIAGNOSIS — Z1231 Encounter for screening mammogram for malignant neoplasm of breast: Secondary | ICD-10-CM

## 2017-09-22 ENCOUNTER — Telehealth: Payer: Self-pay | Admitting: Internal Medicine

## 2017-09-22 NOTE — Telephone Encounter (Signed)
Called pt to see if I could schedule her follow-up visit with MR based on last visit pt had 08/03/17 and MR wanted her to follow up 3 months with pre-bd,post-bd and dlco only.  Pt stated to me at this current time she is not going to schedule a follow up appt with us due to things being better and her not thinking she needs to come in for another appt.  I stated to pt if things became worse and she needed to come in for an appt to call our office and we would gladly get her scheduled.  Nothing further needed at this current time.

## 2017-10-16 DIAGNOSIS — L814 Other melanin hyperpigmentation: Secondary | ICD-10-CM | POA: Diagnosis not present

## 2017-10-16 DIAGNOSIS — D225 Melanocytic nevi of trunk: Secondary | ICD-10-CM | POA: Diagnosis not present

## 2017-10-16 DIAGNOSIS — D1800 Hemangioma unspecified site: Secondary | ICD-10-CM | POA: Diagnosis not present

## 2017-10-16 DIAGNOSIS — D2271 Melanocytic nevi of right lower limb, including hip: Secondary | ICD-10-CM | POA: Diagnosis not present

## 2017-10-19 ENCOUNTER — Other Ambulatory Visit: Payer: Self-pay

## 2017-10-19 ENCOUNTER — Encounter: Payer: Self-pay | Admitting: Obstetrics and Gynecology

## 2017-10-19 ENCOUNTER — Ambulatory Visit (INDEPENDENT_AMBULATORY_CARE_PROVIDER_SITE_OTHER): Payer: BLUE CROSS/BLUE SHIELD | Admitting: Obstetrics and Gynecology

## 2017-10-19 VITALS — BP 120/68 | HR 64 | Resp 14 | Ht 65.75 in | Wt 142.8 lb

## 2017-10-19 DIAGNOSIS — Z9189 Other specified personal risk factors, not elsewhere classified: Secondary | ICD-10-CM

## 2017-10-19 DIAGNOSIS — Z01419 Encounter for gynecological examination (general) (routine) without abnormal findings: Secondary | ICD-10-CM

## 2017-10-19 DIAGNOSIS — Z79890 Hormone replacement therapy (postmenopausal): Secondary | ICD-10-CM | POA: Diagnosis not present

## 2017-10-19 MED ORDER — ESTRADIOL 0.05 MG/24HR TD PTTW: 1.0000 | MEDICATED_PATCH | TRANSDERMAL | 3 refills | Status: DC

## 2017-10-19 NOTE — Progress Notes (Signed)
62 y.o. [id number] MarriedCaucasianF here for annual exam.  She has a h/o TLH, on ERT, decreased her patch to 0.05 mg in the last year. She is doing fine on the lower dose. She has an increased risk of breast cancer. She did the Tyrer-Cuzac model and had a 24.6% risk of breast cancer (will scan into the chart). She would like to have a breast MRI.  No dyspareunia.     No LMP recorded. Patient has had a hysterectomy.          Sexually active: Yes.    The current method of family planning is status post hysterectomy.    Exercising: Yes.    dancing, trainer, gym. Smoker:  no  Health Maintenance: Pap:  2010 Neg History of abnormal Pap:  no MMG: 09-11-17 [id number] Colonoscopy: 2017 polyps removed-repeat in 3-5 yrs  BMD:   n/a TDaP:  2011 Gardasil: no   reports that she has never smoked. She has never used smokeless tobacco. She reports that she drinks about 8.4 oz of alcohol per week. She reports that she does not use drugs. Retired Education officer, environmental. Kids are 22 and 20. Oldest is graduating with his PhD in [employer], younger son is going back to college to get a second degree in Lobbyist.    Past Medical History:  Diagnosis Date  . Asthma   . Essential (primary) hypertension   . HTN (hypertension)   . RAD (reactive airway disease)     Past Surgical History:  Procedure Laterality Date  . BREAST BIOPSY     x3  . LAPAROSCOPIC HYSTERECTOMY      Current Outpatient Medications  Medication Sig Dispense Refill  . Cholecalciferol (VITAMIN D) 2000 units tablet Take 2,000 Units by mouth daily.    Marland Kitchen diltiazem (CARDIZEM CD) 120 MG 24 hr capsule Take 1 capsule (120 mg total) by mouth daily. 90 capsule 3  . estradiol (VIVELLE-DOT) 0.05 MG/24HR patch Place 1 patch onto the skin 2 (two) times a week.    Marland Kitchen ipratropium (ATROVENT) 0.03 % nasal spray ipratropium bromide 0.03 % nasal spray    . nebivolol (BYSTOLIC) 5 MG tablet Take 5 mg by mouth daily.    Marland Kitchen spironolactone (ALDACTONE) 100 MG tablet  spironolactone 100 mg tablet     No current facility-administered medications for this visit.   She started spironolactone for hair loss, it is working.   Family History  Problem Relation Age of Onset  . Thyroid disease Mother   . Heart disease Father   . Hypertension Sister   . Cancer Maternal Grandmother        pancrease    Review of Systems  Constitutional: Negative.   HENT: Negative.   Eyes: Negative.   Respiratory: Negative.   Cardiovascular: Negative.   Gastrointestinal: Negative.   Endocrine: Negative.   Genitourinary: Negative.   Musculoskeletal: Negative.   Skin: Negative.   Allergic/Immunologic: Negative.   Neurological: Negative.   Psychiatric/Behavioral: Negative.     Exam:   BP 120/68 (BP Location: Right Arm, Patient Position: Sitting, Cuff Size: Normal)   Pulse 64   Resp 14   Ht 5' 5.75" (1.67 m)   Wt 142 lb 12.8 oz (64.8 kg)   BMI 23.22 kg/m   Weight change: @WEIGHTCHANGE @ Height:   Height: 5' 5.75" (167 cm)  Ht Readings from Last 3 Encounters:  10/19/17 5' 5.75" (1.67 m)  08/03/17 5\' 6"  (1.676 m)  10/13/16 5\' 6"  (1.676 m)    General appearance: alert, cooperative  and appears stated age Head: Normocephalic, without obvious abnormality, atraumatic Neck: no adenopathy, supple, symmetrical, trachea midline and thyroid normal to inspection and palpation Lungs: clear to auscultation bilaterally Cardiovascular: regular rate and rhythm Breasts: normal appearance, no masses or tenderness, bilateral implants Abdomen: soft, non-tender; non distended,  no masses,  no organomegaly Extremities: extremities normal, atraumatic, no cyanosis or edema Skin: Skin color, texture, turgor normal. No rashes or lesions Lymph nodes: Cervical, supraclavicular, and axillary nodes normal. No abnormal inguinal nodes palpated Neurologic: Grossly normal   Pelvic: External genitalia:  no lesions              Urethra:  normal appearing urethra with no masses, tenderness or  lesions              Bartholins and Skenes: normal                 Vagina: normal appearing vagina with normal color and discharge, no lesions              Cervix: absent               Bimanual Exam:  Uterus:  uterus absent              Adnexa: no mass, fullness, tenderness               Rectovaginal: Confirms               Anus:  normal sphincter tone, no lesions  Chaperone was present for exam.  A:  Well Woman with normal exam  Elevated risk of breast cancer  H/O TLH  P:    No Pap needed  Labs with primary MD  Mammogram UTD  Will set up breast MRI  Discussed breast self exam  Discussed calcium and vit D intake  She would like to continue ERT, she understands the risks  Discussed guidelines for ETOH use

## 2017-10-19 NOTE — Patient Instructions (Signed)

## 2017-10-24 ENCOUNTER — Telehealth: Payer: Self-pay

## 2017-10-24 DIAGNOSIS — Z1231 Encounter for screening mammogram for malignant neoplasm of breast: Secondary | ICD-10-CM

## 2017-10-24 DIAGNOSIS — Z9189 Other specified personal risk factors, not elsewhere classified: Secondary | ICD-10-CM

## 2017-10-24 NOTE — Telephone Encounter (Signed)
-----   Message from Romualdo BolkJill Evelyn Jertson, MD sent at 10/19/2017  2:01 PM EDT ----- Please set up Dr Tresa Resomine for a breast MRI. She has a 24.6% lifetime risk of breast cancer based on the Tyrer-Cuzac model. Thanks, Noreene LarssonJill

## 2017-10-24 NOTE — Telephone Encounter (Signed)
Order for bilateral breast MRI w wo contrast ordered for pre authorization.  Routing to [employer]zy Dixon for [employer] auth.

## 2017-10-30 NOTE — Telephone Encounter (Signed)
Patient is [facility] 11[facility]01/17 [facility] 2[facility]13 [facility]. The authorization number is [id number], aproval is valid 10/27/17 through 11/25/17  Will close encounter.  Forwarding to Dr Oscar LaJertson for review

## 2017-11-01 ENCOUNTER — Ambulatory Visit
Admission: RE | Admit: 2017-11-01 | Discharge: 2017-11-01 | Disposition: A | Discharge status: Home / Self Care | Payer: BLUE CROSS/BLUE SHIELD | Source: Ambulatory Visit | Attending: Obstetrics and Gynecology | Admitting: Obstetrics and Gynecology

## 2017-11-01 DIAGNOSIS — Z9189 Other specified personal risk factors, not elsewhere classified: Secondary | ICD-10-CM

## 2017-11-01 DIAGNOSIS — Z853 Personal history of malignant neoplasm of breast: Secondary | ICD-10-CM | POA: Diagnosis not present

## 2017-11-01 MED ORDER — GADOBENATE DIMEGLUMINE 529 MG/ML IV SOLN
12.0000 mL | Freq: Once | INTRAVENOUS | Status: AC | PRN
  Administered 2017-11-01: 12 mL via INTRAVENOUS

## 2018-01-03 DIAGNOSIS — N183 Chronic kidney disease, stage 3 (moderate): Secondary | ICD-10-CM | POA: Diagnosis not present

## 2018-01-05 ENCOUNTER — Telehealth: Payer: Self-pay

## 2018-01-05 ENCOUNTER — Other Ambulatory Visit: Payer: Self-pay | Admitting: Internal Medicine

## 2018-01-05 DIAGNOSIS — N289 Disorder of kidney and ureter, unspecified: Secondary | ICD-10-CM

## 2018-01-05 NOTE — Telephone Encounter (Signed)
Spoke with patient. Reviewed results with patient as she has not read MyChart message. Patient is aware of results and verbalizes understanding. Encounter closed.  Notes recorded by Sprague, Caroleen HammanKaitlyn E, RN on 11/02/2017 at 11:19 AM EDT Removed from imaging hold. ------  Notes recorded by Romualdo BolkJertson, Jill Evelyn, MD on 11/01/2017 at 5:04 PM EDT Recall in one year Results and any recommendations were sent via MyChart.   Hi Dr Tresa Resomine, Your breast MRI was normal! Have a great night! Gertie ExonJill Jertson

## 2018-01-16 ENCOUNTER — Other Ambulatory Visit: Payer: BLUE CROSS/BLUE SHIELD

## 2018-02-12 DIAGNOSIS — N183 Chronic kidney disease, stage 3 (moderate): Secondary | ICD-10-CM | POA: Diagnosis not present

## 2018-02-12 DIAGNOSIS — Z87898 Personal history of other specified conditions: Secondary | ICD-10-CM | POA: Diagnosis not present

## 2018-02-12 DIAGNOSIS — Z79899 Other long term (current) drug therapy: Secondary | ICD-10-CM | POA: Diagnosis not present

## 2018-02-12 DIAGNOSIS — I10 Essential (primary) hypertension: Secondary | ICD-10-CM | POA: Diagnosis not present

## 2018-02-12 DIAGNOSIS — E7800 Pure hypercholesterolemia, unspecified: Secondary | ICD-10-CM | POA: Diagnosis not present

## 2018-02-12 DIAGNOSIS — E559 Vitamin D deficiency, unspecified: Secondary | ICD-10-CM | POA: Diagnosis not present

## 2018-02-12 DIAGNOSIS — J449 Chronic obstructive pulmonary disease, unspecified: Secondary | ICD-10-CM | POA: Diagnosis not present

## 2018-02-12 DIAGNOSIS — Z0000 Encounter for general adult medical examination without abnormal findings: Secondary | ICD-10-CM | POA: Diagnosis not present

## 2018-02-20 ENCOUNTER — Ambulatory Visit
Admission: RE | Admit: 2018-02-20 | Discharge: 2018-02-20 | Disposition: A | Discharge status: Home / Self Care | Payer: BLUE CROSS/BLUE SHIELD | Source: Ambulatory Visit | Attending: Internal Medicine | Admitting: Internal Medicine

## 2018-02-20 DIAGNOSIS — N281 Cyst of kidney, acquired: Secondary | ICD-10-CM | POA: Diagnosis not present

## 2018-02-20 DIAGNOSIS — N289 Disorder of kidney and ureter, unspecified: Secondary | ICD-10-CM

## 2018-02-27 DIAGNOSIS — I129 Hypertensive chronic kidney disease with stage 1 through stage 4 chronic kidney disease, or unspecified chronic kidney disease: Secondary | ICD-10-CM | POA: Diagnosis not present

## 2018-02-27 DIAGNOSIS — R7989 Other specified abnormal findings of blood chemistry: Secondary | ICD-10-CM | POA: Diagnosis not present

## 2018-03-02 DIAGNOSIS — R7989 Other specified abnormal findings of blood chemistry: Secondary | ICD-10-CM | POA: Diagnosis not present

## 2018-05-02 DIAGNOSIS — Z23 Encounter for immunization: Secondary | ICD-10-CM | POA: Diagnosis not present

## 2018-05-15 DIAGNOSIS — R7989 Other specified abnormal findings of blood chemistry: Secondary | ICD-10-CM | POA: Diagnosis not present

## 2018-05-28 DIAGNOSIS — R52 Pain, unspecified: Secondary | ICD-10-CM | POA: Diagnosis not present

## 2018-05-28 DIAGNOSIS — T148XXA Other injury of unspecified body region, initial encounter: Secondary | ICD-10-CM | POA: Diagnosis not present

## 2018-05-28 DIAGNOSIS — L942 Calcinosis cutis: Secondary | ICD-10-CM | POA: Diagnosis not present

## 2018-06-01 DIAGNOSIS — M67911 Unspecified disorder of synovium and tendon, right shoulder: Secondary | ICD-10-CM | POA: Diagnosis not present

## 2018-07-16 DIAGNOSIS — M67911 Unspecified disorder of synovium and tendon, right shoulder: Secondary | ICD-10-CM | POA: Diagnosis not present

## 2018-07-26 DIAGNOSIS — H2513 Age-related nuclear cataract, bilateral: Secondary | ICD-10-CM | POA: Diagnosis not present

## 2018-07-31 DIAGNOSIS — S161XXD Strain of muscle, fascia and tendon at neck level, subsequent encounter: Secondary | ICD-10-CM | POA: Diagnosis not present

## 2018-07-31 DIAGNOSIS — M7541 Impingement syndrome of right shoulder: Secondary | ICD-10-CM | POA: Diagnosis not present

## 2018-08-10 DIAGNOSIS — M7541 Impingement syndrome of right shoulder: Secondary | ICD-10-CM | POA: Diagnosis not present

## 2018-08-10 DIAGNOSIS — S161XXD Strain of muscle, fascia and tendon at neck level, subsequent encounter: Secondary | ICD-10-CM | POA: Diagnosis not present

## 2018-08-14 DIAGNOSIS — M7541 Impingement syndrome of right shoulder: Secondary | ICD-10-CM | POA: Diagnosis not present

## 2018-08-14 DIAGNOSIS — S161XXD Strain of muscle, fascia and tendon at neck level, subsequent encounter: Secondary | ICD-10-CM | POA: Diagnosis not present

## 2018-08-17 DIAGNOSIS — M7541 Impingement syndrome of right shoulder: Secondary | ICD-10-CM | POA: Diagnosis not present

## 2018-08-17 DIAGNOSIS — S161XXD Strain of muscle, fascia and tendon at neck level, subsequent encounter: Secondary | ICD-10-CM | POA: Diagnosis not present

## 2018-08-21 DIAGNOSIS — M7541 Impingement syndrome of right shoulder: Secondary | ICD-10-CM | POA: Diagnosis not present

## 2018-08-21 DIAGNOSIS — S161XXD Strain of muscle, fascia and tendon at neck level, subsequent encounter: Secondary | ICD-10-CM | POA: Diagnosis not present

## 2018-08-22 DIAGNOSIS — R7989 Other specified abnormal findings of blood chemistry: Secondary | ICD-10-CM | POA: Diagnosis not present

## 2018-08-24 DIAGNOSIS — M7541 Impingement syndrome of right shoulder: Secondary | ICD-10-CM | POA: Diagnosis not present

## 2018-08-24 DIAGNOSIS — S161XXD Strain of muscle, fascia and tendon at neck level, subsequent encounter: Secondary | ICD-10-CM | POA: Diagnosis not present

## 2018-09-10 DIAGNOSIS — X58XXXA Exposure to other specified factors, initial encounter: Secondary | ICD-10-CM | POA: Diagnosis not present

## 2018-09-10 DIAGNOSIS — S43431A Superior glenoid labrum lesion of right shoulder, initial encounter: Secondary | ICD-10-CM | POA: Insufficient documentation

## 2018-09-10 DIAGNOSIS — M25511 Pain in right shoulder: Secondary | ICD-10-CM | POA: Diagnosis not present

## 2018-09-10 DIAGNOSIS — Z882 Allergy status to sulfonamides status: Secondary | ICD-10-CM | POA: Diagnosis not present

## 2018-10-10 ENCOUNTER — Encounter: Payer: Self-pay | Admitting: Obstetrics and Gynecology

## 2018-10-15 ENCOUNTER — Telehealth: Payer: Self-pay

## 2018-10-15 NOTE — Telephone Encounter (Signed)
Spoke with patient regarding aex exam that will be cancelled due to the coronavirus restrictions. Patient request to be put in 02 recall & be called & scheduled when our schedules open back up. Patient states she does not need a refill of any medications & is not having any problems. Routed to [place], Charity fundraiser

## 2018-10-25 ENCOUNTER — Ambulatory Visit: Payer: BLUE CROSS/BLUE SHIELD | Admitting: Obstetrics and Gynecology

## 2018-12-10 ENCOUNTER — Telehealth: Payer: Self-pay | Admitting: Obstetrics and Gynecology

## 2018-12-10 NOTE — Telephone Encounter (Signed)
Patient canceled her appointment for vulvar lesion 12/11/18. She stated that she no longer needs the appointment due to "figuring out her problem".

## 2018-12-11 ENCOUNTER — Ambulatory Visit: Payer: BLUE CROSS/BLUE SHIELD | Admitting: Obstetrics and Gynecology

## 2018-12-24 DIAGNOSIS — D510 Vitamin B12 deficiency anemia due to intrinsic factor deficiency: Secondary | ICD-10-CM | POA: Diagnosis not present

## 2018-12-24 DIAGNOSIS — E559 Vitamin D deficiency, unspecified: Secondary | ICD-10-CM | POA: Diagnosis not present

## 2018-12-24 DIAGNOSIS — R509 Fever, unspecified: Secondary | ICD-10-CM | POA: Diagnosis not present

## 2018-12-24 DIAGNOSIS — I10 Essential (primary) hypertension: Secondary | ICD-10-CM | POA: Diagnosis not present

## 2018-12-24 DIAGNOSIS — I129 Hypertensive chronic kidney disease with stage 1 through stage 4 chronic kidney disease, or unspecified chronic kidney disease: Secondary | ICD-10-CM | POA: Diagnosis not present

## 2019-01-23 ENCOUNTER — Other Ambulatory Visit: Payer: Self-pay | Admitting: Obstetrics and Gynecology

## 2019-01-23 DIAGNOSIS — N183 Chronic kidney disease, stage 3 (moderate): Secondary | ICD-10-CM | POA: Diagnosis not present

## 2019-01-23 MED ORDER — ESTRADIOL 0.075 MG/24HR TD PTTW: 1.0000 | MEDICATED_PATCH | TRANSDERMAL | 0 refills | Status: DC

## 2019-01-23 NOTE — Telephone Encounter (Signed)
Patient is requesting to change the dosage on her vivelle dot to 0.075 mg. [facility] on [address] at  [phone]

## 2019-01-23 NOTE — Telephone Encounter (Signed)
Spoke with patient. She states she was on Vivelle dot 0.1mg  for a long time, then decreased to 0.075mg  then down to 0.05mg . patient states she is "most happiest" at 0.075mg  and is requesting Rx for this to be sent to her pharmacy. She states she has less vaginal dryness on 0.075mg .Routed to provider  Last AEX :10-19-17 Next AEX:  02-28-19 Last MMG:11-01-17 MRI/Neg/BiRads1--Pt.reluctant to schedule this year due to COVID--but promises to make appt.

## 2019-02-18 ENCOUNTER — Other Ambulatory Visit: Payer: Self-pay | Admitting: Obstetrics and Gynecology

## 2019-02-18 DIAGNOSIS — D2261 Melanocytic nevi of right upper limb, including shoulder: Secondary | ICD-10-CM | POA: Diagnosis not present

## 2019-02-18 DIAGNOSIS — Z1231 Encounter for screening mammogram for malignant neoplasm of breast: Secondary | ICD-10-CM

## 2019-02-18 DIAGNOSIS — D485 Neoplasm of uncertain behavior of skin: Secondary | ICD-10-CM | POA: Diagnosis not present

## 2019-02-18 DIAGNOSIS — L814 Other melanin hyperpigmentation: Secondary | ICD-10-CM | POA: Diagnosis not present

## 2019-02-18 DIAGNOSIS — D225 Melanocytic nevi of trunk: Secondary | ICD-10-CM | POA: Diagnosis not present

## 2019-02-18 DIAGNOSIS — D2271 Melanocytic nevi of right lower limb, including hip: Secondary | ICD-10-CM | POA: Diagnosis not present

## 2019-02-21 DIAGNOSIS — Z0000 Encounter for general adult medical examination without abnormal findings: Secondary | ICD-10-CM | POA: Diagnosis not present

## 2019-02-22 DIAGNOSIS — Z23 Encounter for immunization: Secondary | ICD-10-CM | POA: Diagnosis not present

## 2019-02-22 DIAGNOSIS — N183 Chronic kidney disease, stage 3 (moderate): Secondary | ICD-10-CM | POA: Diagnosis not present

## 2019-02-27 NOTE — Progress Notes (Signed)
63 y.o. [id number] Married White or Caucasian Not Hispanic or Latino female here for annual exam.  H/O TLH, on ERT. She was on 0.05 mg patch, went up to 0.075 mg patch secondary to vulvar irritation and dryness. The increased dose of ERT helped her vaginal irritation, but then she developed breast cancer. She denies vaginal discharge, but has occasionally noticed an odor after intercourse.   She had a labial plasty ~5 years ago, feels that the wound broke down focally.   She has an increased risk of breast cancer. Tyrer-Cuzac risk is 24.6%. She has concerns about MRI secondary to risks with the contrast.  She was taking spironolactone and her creatinine went up. Last elevated level was 1.28. She stopped the spironolactone, creatinine is now normal. She is worried about having the MRI contrast repeatedly and risks to her kidneys. She has also read about possible brain deposits with the contrast.      No LMP recorded. Patient has had a hysterectomy.          Sexually active: Yes.    The current method of family planning is status post hysterectomy.    Exercising: Yes.    cardio, weight training Smoker:  no  Health Maintenance: Pap:  2010 Neg History of abnormal Pap:  no MMG: 09-11-17 [id number], Breast MRI 11/01/2017 WNL Colonoscopy:2017 polyps removed-repeat in 3-5 yrs BMD:   n/a TDaP:  2011 Gardasil: no   reports that she has never smoked. She has never used smokeless tobacco. She reports current alcohol use of about 14.0 standard drinks of alcohol per week. She reports that she does not use drugs. Retired Education officer, environmentalGYN. 2 grown sons, one with his PhD in [employer] Science, other getting a second degree in LobbyistComputer Science.   Past Medical History:  Diagnosis Date  . Asthma   . Essential (primary) hypertension   . HTN (hypertension)   . RAD (reactive airway disease)     Past Surgical History:  Procedure Laterality Date  . BREAST BIOPSY     x3  . LAPAROSCOPIC HYSTERECTOMY      Current  Outpatient Medications  Medication Sig Dispense Refill  . Cholecalciferol (VITAMIN D) 2000 units tablet Take 2,000 Units by mouth daily.    Marland Kitchen. diltiazem (CARDIZEM CD) 120 MG 24 hr capsule Take 1 capsule (120 mg total) by mouth daily. 90 capsule 3  . estradiol (VIVELLE-DOT) 0.075 MG/24HR Place 1 patch onto the skin 2 (two) times a week. 24 patch 0  . ipratropium (ATROVENT) 0.03 % nasal spray ipratropium bromide 0.03 % nasal spray    . nebivolol (BYSTOLIC) 5 MG tablet Take 5 mg by mouth daily.     No current facility-administered medications for this visit.     Family History  Problem Relation Age of Onset  . Thyroid disease Mother   . Heart disease Father   . Hypertension Sister   . Cancer Maternal Grandmother        pancrease    Review of Systems  Constitutional: Negative.   HENT: Negative.   Eyes: Negative.   Respiratory: Negative.   Cardiovascular: Negative.   Gastrointestinal: Negative.   Endocrine: Negative.   Genitourinary: Negative.   Musculoskeletal: Negative.   Skin: Negative.   Allergic/Immunologic: Negative.   Neurological: Negative.   Hematological: Negative.   Psychiatric/Behavioral: Negative.     Exam:   BP 126/82 (BP Location: Right Arm, Patient Position: Sitting, Cuff Size: Normal)   Pulse 60   Temp 97.7 F (36.5 C) (Skin)  Ht 5' 5.75" (1.67 m)   Wt 152 lb 12.8 oz (69.3 kg)   BMI 24.85 kg/m   Weight change: @WEIGHTCHANGE @ Height:   Height: 5' 5.75" (167 cm)  Ht Readings from Last 3 Encounters:  02/28/19 5' 5.75" (1.67 m)  10/19/17 5' 5.75" (1.67 m)  08/03/17 5\' 6"  (1.676 m)    General appearance: alert, cooperative and appears stated age Head: Normocephalic, without obvious abnormality, atraumatic Neck: no adenopathy, supple, symmetrical, trachea midline and thyroid normal to inspection and palpation Lungs: clear to auscultation bilaterally Cardiovascular: regular rate and rhythm Breasts: normal appearance, no masses or tenderness Abdomen:  soft, non-tender; non distended,  no masses,  no organomegaly Extremities: extremities normal, atraumatic, no cyanosis or edema Skin: Skin color, texture, turgor normal. No rashes or lesions Lymph nodes: Cervical, supraclavicular, and axillary nodes normal. No abnormal inguinal nodes palpated Neurologic: Grossly normal   Pelvic: External genitalia:  no lesions, atrophic, on the left inner labia minora there is a distinct white, flat lesion, ~1 cm, appears to have a laceration on the edge of the lesion. No signs of infection.               Urethra:  normal appearing urethra with no masses, tenderness or lesions              Bartholins and Skenes: normal                 Vagina: normal appearing vagina with an increase in thick, clumpy vaginal d/c              Cervix: absent               Bimanual Exam:  Uterus:  uterus absent              Adnexa: no mass, fullness, tenderness               Rectovaginal: Confirms               Anus:  normal sphincter tone, no lesions  Chaperone was present for exam.  Wet prep: ? clue, no trich, + wbc KOH: + yeast PH: 5   A:  Well Woman with normal exam  H/O hysterectomy  Elevated risk of breast cancer  On ERT, wants to continue, willing to go down to 0.05 mg   Some vaginal dryness, desires vaginal estrogen cream  Vulvar lesion  Yeast vaginitis, possible BV (she does c/o odor after intercourse)  P:   No pap needed  Yearly mammogram, she has some concerns about MRI and the risk of the contrast potentially effecting her kidneys. Will check with Radiology about sensitivity of MRI without contrast  Colonoscopy  Screening labs with primary MD  TDAP  Terazol for yeast  Send affirm  Return for vulvar biopsy

## 2019-02-28 ENCOUNTER — Ambulatory Visit (INDEPENDENT_AMBULATORY_CARE_PROVIDER_SITE_OTHER): Payer: BC Managed Care – PPO | Admitting: Obstetrics and Gynecology

## 2019-02-28 ENCOUNTER — Other Ambulatory Visit: Payer: Self-pay

## 2019-02-28 ENCOUNTER — Encounter: Payer: Self-pay | Admitting: Obstetrics and Gynecology

## 2019-02-28 VITALS — BP 126/82 | HR 60 | Temp 97.7°F | Ht 65.75 in | Wt 152.8 lb

## 2019-02-28 DIAGNOSIS — N760 Acute vaginitis: Secondary | ICD-10-CM

## 2019-02-28 DIAGNOSIS — N9089 Other specified noninflammatory disorders of vulva and perineum: Secondary | ICD-10-CM | POA: Diagnosis not present

## 2019-02-28 DIAGNOSIS — Z01419 Encounter for gynecological examination (general) (routine) without abnormal findings: Secondary | ICD-10-CM

## 2019-02-28 DIAGNOSIS — Z79890 Hormone replacement therapy (postmenopausal): Secondary | ICD-10-CM | POA: Diagnosis not present

## 2019-02-28 DIAGNOSIS — Z9189 Other specified personal risk factors, not elsewhere classified: Secondary | ICD-10-CM | POA: Diagnosis not present

## 2019-02-28 DIAGNOSIS — Z23 Encounter for immunization: Secondary | ICD-10-CM | POA: Diagnosis not present

## 2019-02-28 MED ORDER — TERCONAZOLE 0.4 % VA CREA: 1.0000 | TOPICAL_CREAM | Freq: Every day | VAGINAL | 0 refills | Status: DC

## 2019-02-28 MED ORDER — ESTRADIOL 0.1 MG/GM VA CREA: TOPICAL_CREAM | VAGINAL | 1 refills | Status: DC

## 2019-02-28 MED ORDER — ESTRADIOL 0.05 MG/24HR TD PTTW: 1.0000 | MEDICATED_PATCH | TRANSDERMAL | 3 refills | Status: DC

## 2019-02-28 NOTE — Patient Instructions (Signed)
EXERCISE AND DIET:  We recommended that you start or continue a regular exercise program for good health. Regular exercise means any activity that makes your heart beat faster and makes you sweat.  We recommend exercising at least 30 minutes per day at least 3 days a week, preferably 4 or 5.  We also recommend a diet low in fat and sugar.  Inactivity, poor dietary choices and obesity can cause diabetes, heart attack, stroke, and kidney damage, among others.    ALCOHOL AND SMOKING:  Women should limit their alcohol intake to no more than 7 drinks/beers/glasses of wine (combined, not each!) per week. Moderation of alcohol intake to this level decreases your risk of breast cancer and liver damage. And of course, no recreational drugs are part of a healthy lifestyle.  And absolutely no smoking or even second hand smoke. Most people know smoking can cause heart and lung diseases, but did you know it also contributes to weakening of your bones? Aging of your skin?  Yellowing of your teeth and nails?  CALCIUM AND VITAMIN D:  Adequate intake of calcium and Vitamin D are recommended.  The recommendations for exact amounts of these supplements seem to change often, but generally speaking 1,2oo mg of calcium (between diet and supplement) and 800 units of Vitamin D per day seems prudent. Certain women may benefit from higher intake of Vitamin D.  If you are among these women, your doctor will have told you during your visit.    PAP SMEARS:  Pap smears, to check for cervical cancer or precancers,  have traditionally been done yearly, although recent scientific advances have shown that most women can have pap smears less often.  However, every woman still should have a physical exam from her gynecologist every year. It will include a breast check, inspection of the vulva and vagina to check for abnormal growths or skin changes, a visual exam of the cervix, and then an exam to evaluate the size and shape of the uterus and  ovaries.  And after 63 years of age, a rectal exam is indicated to check for rectal cancers. We will also provide age appropriate advice regarding health maintenance, like when you should have certain vaccines, screening for sexually transmitted diseases, bone density testing, colonoscopy, mammograms, etc.   MAMMOGRAMS:  All women over 54 years old should have a yearly mammogram. Many facilities now offer a "3D" mammogram, which may cost around $50 extra out of pocket. If possible,  we recommend you accept the option to have the 3D mammogram performed.  It both reduces the number of women who will be called back for extra views which then turn out to be normal, and it is better than the routine mammogram at detecting truly abnormal areas.    COLON CANCER SCREENING: Now recommend starting at age 79. At this time colonoscopy is not covered for routine screening until 50. There are take home tests that can be done between 45-49.   COLONOSCOPY:  Colonoscopy to screen for colon cancer is recommended for all women at age 62.  We know, you hate the idea of the prep.  We agree, BUT, having colon cancer and not knowing it is worse!!  Colon cancer so often starts as a polyp that can be seen and removed at colonscopy, which can quite literally save your life!  And if your first colonoscopy is normal and you have no family history of colon cancer, most women don't have to have it again for  10 years.  Once every ten years, you can do something that may end up saving your life, right?  We will be happy to help you get it scheduled when you are ready.  Be sure to check your insurance coverage so you understand how much it will cost.  It may be covered as a preventative service at no cost, but you should check your particular policy.   ° ° ° °Breast Self-Awareness °Breast self-awareness means being familiar with how your breasts look and feel. It involves checking your breasts regularly and reporting any changes to your  health care provider. °Practicing breast self-awareness is important. A change in your breasts can be a sign of a serious medical problem. Being familiar with how your breasts look and feel allows you to find any problems early, when treatment is more likely to be successful. All women should practice breast self-awareness, including women who have had breast implants. °How to do a breast self-exam °One way to learn what is normal for your breasts and whether your breasts are changing is to do a breast self-exam. To do a breast self-exam: °Look for Changes ° °1. Remove all the clothing above your waist. °2. Stand in front of a mirror in a room with good lighting. °3. Put your hands on your hips. °4. Push your hands firmly downward. °5. Compare your breasts in the mirror. Look for differences between them (asymmetry), such as: °? Differences in shape. °? Differences in size. °? Puckers, dips, and bumps in one breast and not the other. °6. Look at each breast for changes in your skin, such as: °? Redness. °? Scaly areas. °7. Look for changes in your nipples, such as: °? Discharge. °? Bleeding. °? Dimpling. °? Redness. °? A change in position. °Feel for Changes °Carefully feel your breasts for lumps and changes. It is best to do this while lying on your back on the floor and again while sitting or standing in the shower or tub with soapy water on your skin. Feel each breast in the following way: °· Place the arm on the side of the breast you are examining above your head. °· Feel your breast with the other hand. °· Start in the nipple area and make ¾ inch (2 cm) overlapping circles to feel your breast. Use the pads of your three middle fingers to do this. Apply light pressure, then medium pressure, then firm pressure. The light pressure will allow you to feel the tissue closest to the skin. The medium pressure will allow you to feel the tissue that is a little deeper. The firm pressure will allow you to feel the tissue  close to the ribs. °· Continue the overlapping circles, moving downward over the breast until you feel your ribs below your breast. °· Move one finger-width toward the center of the body. Continue to use the ¾ inch (2 cm) overlapping circles to feel your breast as you move slowly up toward your collarbone. °· Continue the up and down exam using all three pressures until you reach your armpit. ° °Write Down What You Find ° °Write down what is normal for each breast and any changes that you find. Keep a written record with breast changes or normal findings for each breast. By writing this information down, you do not need to depend only on memory for size, tenderness, or location. Write down where you are in your menstrual cycle, if you are still menstruating. °If you are having trouble noticing differences   in your breasts, do not get discouraged. With time you will become more familiar with the variations in your breasts and more comfortable with the exam. How often should I examine my breasts? Examine your breasts every month. If you are breastfeeding, the best time to examine your breasts is after a feeding or after using a breast pump. If you menstruate, the best time to examine your breasts is 5-7 days after your period is over. During your period, your breasts are lumpier, and it may be more difficult to notice changes. When should I see my health care provider? See your health care provider if you notice:  A change in shape or size of your breasts or nipples.  A change in the skin of your breast or nipples, such as a reddened or scaly area.  Unusual discharge from your nipples.  A lump or thick area that was not there before.  Pain in your breasts.  Anything that concerns you.

## 2019-03-01 ENCOUNTER — Other Ambulatory Visit: Payer: Self-pay | Admitting: Obstetrics and Gynecology

## 2019-03-01 LAB — VAGINITIS/VAGINOSIS, DNA PROBE
Candida Species: POSITIVE — AB
Gardnerella vaginalis: POSITIVE — AB
Trichomonas vaginosis: NEGATIVE

## 2019-03-01 MED ORDER — METRONIDAZOLE 0.75 % VA GEL: 1.0000 | Freq: Every day | VAGINAL | 0 refills | Status: DC

## 2019-03-04 ENCOUNTER — Telehealth: Payer: Self-pay | Admitting: Obstetrics and Gynecology

## 2019-03-04 NOTE — Telephone Encounter (Signed)
mychart message sent regarding breast MRI.

## 2019-03-05 ENCOUNTER — Ambulatory Visit (INDEPENDENT_AMBULATORY_CARE_PROVIDER_SITE_OTHER): Payer: BC Managed Care – PPO | Admitting: Obstetrics and Gynecology

## 2019-03-05 ENCOUNTER — Other Ambulatory Visit: Payer: Self-pay

## 2019-03-05 ENCOUNTER — Encounter: Payer: Self-pay | Admitting: Obstetrics and Gynecology

## 2019-03-05 DIAGNOSIS — N9089 Other specified noninflammatory disorders of vulva and perineum: Secondary | ICD-10-CM

## 2019-03-05 DIAGNOSIS — L280 Lichen simplex chronicus: Secondary | ICD-10-CM | POA: Diagnosis not present

## 2019-03-05 NOTE — Progress Notes (Signed)
GYNECOLOGY  VISIT   HPI: 63 y.o.   Married White or Caucasian Not Hispanic or Latino  female   [phone] with No LMP recorded. Patient has had a hysterectomy.   here for vulvar biopsy. She was noticed to have a vulvar lesion at her annual exam last week. H/O non vulvar lichen planus.    GYNECOLOGIC HISTORY: No LMP recorded. Patient has had a hysterectomy. Contraception: Hysterectomy  Menopausal hormone therapy: Estrace vaginal cream, Estradiol patch        OB History    Gravida  3   Para  2   Term  2   Preterm      AB  1   Living  2     SAB  1   TAB      Ectopic      Multiple      Live Births  2              Patient Active Problem List   Diagnosis Date Noted  . Reactive airways dysfunction syndrome ([place]) 08/03/2017  . Dyspnea 02/02/2016  . Inappropriate sinus tachycardia 10/14/2014  . HTN (hypertension) 10/14/2014    Past Medical History:  Diagnosis Date  . Asthma   . Essential (primary) hypertension   . HTN (hypertension)   . RAD (reactive airway disease)     Past Surgical History:  Procedure Laterality Date  . BREAST BIOPSY     x3  . LAPAROSCOPIC HYSTERECTOMY      Current Outpatient Medications  Medication Sig Dispense Refill  . Cholecalciferol (VITAMIN D) 2000 units tablet Take 2,000 Units by mouth daily.    Marland Kitchen diltiazem (CARDIZEM CD) 120 MG 24 hr capsule Take 1 capsule (120 mg total) by mouth daily. 90 capsule 3  . estradiol (ESTRACE) 0.1 MG/GM vaginal cream 1 gram vaginally twice weekly 42.5 g 1  . estradiol (VIVELLE-DOT) 0.05 MG/24HR patch Place 1 patch (0.05 mg total) onto the skin 2 (two) times a week. 24 patch 3  . ipratropium (ATROVENT) 0.03 % nasal spray ipratropium bromide 0.03 % nasal spray    . metroNIDAZOLE (METROGEL) 0.75 % vaginal gel Place 1 Applicatorful vaginally at bedtime. Use for 5 nights 70 g 0  . nebivolol (BYSTOLIC) 5 MG tablet Take 5 mg by mouth daily.    Marland Kitchen terconazole (TERAZOL 7) 0.4 % vaginal cream Place 1 applicator  vaginally at bedtime. One applicator full QHS for seven days of therapy 45 g 0   No current facility-administered medications for this visit.      ALLERGIES: Blue dyes (parenteral) and Sulfa antibiotics  Family History  Problem Relation Age of Onset  . Thyroid disease Mother   . Heart disease Father   . Hypertension Sister   . Cancer Maternal Grandmother        pancrease    Social History   Socioeconomic History  . Marital status: Married    Spouse name: Not on file  . Number of children: Not on file  . Years of education: Not on file  . Highest education level: Not on file  Occupational History  . Not on file  Social Needs  . Financial resource strain: Not on file  . Food insecurity    Worry: Not on file    Inability: Not on file  . Transportation needs    Medical: Not on file    Non-medical: Not on file  Tobacco Use  . Smoking status: Never Smoker  . Smokeless tobacco: Never Used  Substance and Sexual Activity  . Alcohol use: Yes    Alcohol/week: 14.0 standard drinks    Types: 14 Standard drinks or equivalent per week  . Drug use: No  . Sexual activity: Yes    Partners: Male    Birth control/protection: Surgical  Lifestyle  . Physical activity    Days per week: Not on file    Minutes per session: Not on file  . Stress: Not on file  Relationships  . Social Musicianconnections    Talks on phone: Not on file    Gets together: Not on file    Attends religious service: Not on file    Active member of club or organization: Not on file    Attends meetings of clubs or organizations: Not on file    Relationship status: Not on file  . Intimate partner violence    Fear of current or ex partner: Not on file    Emotionally abused: Not on file    Physically abused: Not on file    Forced sexual activity: Not on file  Other Topics Concern  . Not on file  Social History Narrative  . Not on file    Review of Systems  Constitutional: Negative.   HENT: Negative.   Eyes:  Negative.   Respiratory: Negative.   Cardiovascular: Negative.   Gastrointestinal: Negative.   Genitourinary: Negative.   Musculoskeletal: Negative.   Skin: Negative.   Neurological: Negative.   Endo/Heme/Allergies: Negative.   Psychiatric/Behavioral: Negative.     PHYSICAL EXAMINATION:    BP 120/82 (BP Location: Right Arm, Patient Position: Sitting, Cuff Size: Normal)   Pulse 64   Temp 98.7 F (37.1 C) (Skin)   Wt 154 lb 3.2 oz (69.9 kg)   BMI 25.08 kg/m     General appearance: alert, cooperative and appears stated age   Pelvic: External genitalia:  Flat white lesion on her inner left vulva, ~1 cm, laceration on the upper edge, healing.              Urethra:  normal appearing urethra with no masses, tenderness or lesions                The risks of the procedure were reviewed with the patient and a consent was signed. The area was cleansed with betadine and injected with 1% lidocaine. A 4 mm punch biopsy was used to remove a circular piece of tissue, including the normal tissue edge. The defect was closed with 4-0 vicryl. The patient tolerated the procedure well.   Chaperone was present for exam.  ASSESSMENT Vulvar lesion    PLAN Biopsy done   An After Visit Summary was printed and given to the patient.

## 2019-03-05 NOTE — Patient Instructions (Signed)

## 2019-03-08 ENCOUNTER — Other Ambulatory Visit: Payer: Self-pay | Admitting: Obstetrics and Gynecology

## 2019-03-08 MED ORDER — BETAMETHASONE VALERATE 0.1 % EX OINT: TOPICAL_OINTMENT | CUTANEOUS | 0 refills | Status: DC

## 2019-04-03 ENCOUNTER — Ambulatory Visit
Admission: RE | Admit: 2019-04-03 | Discharge: 2019-04-03 | Disposition: A | Discharge status: Home / Self Care | Payer: BLUE CROSS/BLUE SHIELD | Source: Ambulatory Visit | Attending: Obstetrics and Gynecology | Admitting: Obstetrics and Gynecology

## 2019-04-03 ENCOUNTER — Other Ambulatory Visit: Payer: Self-pay

## 2019-04-03 DIAGNOSIS — Z1231 Encounter for screening mammogram for malignant neoplasm of breast: Secondary | ICD-10-CM

## 2019-05-02 DIAGNOSIS — N183 Chronic kidney disease, stage 3 unspecified: Secondary | ICD-10-CM | POA: Diagnosis not present

## 2019-06-12 DIAGNOSIS — M67911 Unspecified disorder of synovium and tendon, right shoulder: Secondary | ICD-10-CM | POA: Diagnosis not present

## 2019-06-18 DIAGNOSIS — M7541 Impingement syndrome of right shoulder: Secondary | ICD-10-CM | POA: Diagnosis not present

## 2019-06-18 DIAGNOSIS — G8918 Other acute postprocedural pain: Secondary | ICD-10-CM | POA: Diagnosis not present

## 2019-06-18 DIAGNOSIS — M75111 Incomplete rotator cuff tear or rupture of right shoulder, not specified as traumatic: Secondary | ICD-10-CM | POA: Diagnosis not present

## 2019-06-18 DIAGNOSIS — M7551 Bursitis of right shoulder: Secondary | ICD-10-CM | POA: Diagnosis not present

## 2019-06-18 DIAGNOSIS — M24111 Other articular cartilage disorders, right shoulder: Secondary | ICD-10-CM | POA: Diagnosis not present

## 2019-07-02 DIAGNOSIS — Z4789 Encounter for other orthopedic aftercare: Secondary | ICD-10-CM | POA: Diagnosis not present

## 2019-07-02 DIAGNOSIS — M25611 Stiffness of right shoulder, not elsewhere classified: Secondary | ICD-10-CM | POA: Diagnosis not present

## 2019-07-04 DIAGNOSIS — Z4789 Encounter for other orthopedic aftercare: Secondary | ICD-10-CM | POA: Diagnosis not present

## 2019-07-04 DIAGNOSIS — M25611 Stiffness of right shoulder, not elsewhere classified: Secondary | ICD-10-CM | POA: Diagnosis not present

## 2019-07-08 DIAGNOSIS — M67912 Unspecified disorder of synovium and tendon, left shoulder: Secondary | ICD-10-CM | POA: Diagnosis not present

## 2019-07-08 DIAGNOSIS — M24812 Other specific joint derangements of left shoulder, not elsewhere classified: Secondary | ICD-10-CM | POA: Diagnosis not present

## 2019-07-10 DIAGNOSIS — M25611 Stiffness of right shoulder, not elsewhere classified: Secondary | ICD-10-CM | POA: Diagnosis not present

## 2019-07-10 DIAGNOSIS — Z4789 Encounter for other orthopedic aftercare: Secondary | ICD-10-CM | POA: Diagnosis not present

## 2019-07-12 DIAGNOSIS — Z4789 Encounter for other orthopedic aftercare: Secondary | ICD-10-CM | POA: Diagnosis not present

## 2019-07-12 DIAGNOSIS — M25611 Stiffness of right shoulder, not elsewhere classified: Secondary | ICD-10-CM | POA: Diagnosis not present

## 2019-07-15 DIAGNOSIS — M25611 Stiffness of right shoulder, not elsewhere classified: Secondary | ICD-10-CM | POA: Diagnosis not present

## 2019-07-15 DIAGNOSIS — Z4789 Encounter for other orthopedic aftercare: Secondary | ICD-10-CM | POA: Diagnosis not present

## 2019-07-17 DIAGNOSIS — M25611 Stiffness of right shoulder, not elsewhere classified: Secondary | ICD-10-CM | POA: Diagnosis not present

## 2019-07-17 DIAGNOSIS — Z4789 Encounter for other orthopedic aftercare: Secondary | ICD-10-CM | POA: Diagnosis not present

## 2019-07-22 DIAGNOSIS — M25611 Stiffness of right shoulder, not elsewhere classified: Secondary | ICD-10-CM | POA: Diagnosis not present

## 2019-07-22 DIAGNOSIS — Z4789 Encounter for other orthopedic aftercare: Secondary | ICD-10-CM | POA: Diagnosis not present

## 2019-07-24 ENCOUNTER — Encounter: Payer: Self-pay | Admitting: Obstetrics and Gynecology

## 2019-07-24 ENCOUNTER — Other Ambulatory Visit: Payer: Self-pay | Admitting: Obstetrics and Gynecology

## 2019-07-24 DIAGNOSIS — N760 Acute vaginitis: Secondary | ICD-10-CM

## 2019-07-24 DIAGNOSIS — M25611 Stiffness of right shoulder, not elsewhere classified: Secondary | ICD-10-CM | POA: Diagnosis not present

## 2019-07-24 DIAGNOSIS — Z4789 Encounter for other orthopedic aftercare: Secondary | ICD-10-CM | POA: Diagnosis not present

## 2019-07-24 MED ORDER — TERCONAZOLE 0.4 % VA CREA: 1.0000 | TOPICAL_CREAM | Freq: Every day | VAGINAL | 0 refills | Status: DC

## 2019-07-24 NOTE — Telephone Encounter (Signed)
Spoke to Donna King. Donna King states having discharge and itching x 2-3 days. Donna King requesting Terazol 7 cream for yeast sx.   Will route to Dr Talbert Nan for review and recommendaitons and possible Rx. Rx pended If approved.

## 2019-07-24 NOTE — Telephone Encounter (Signed)
Patient sent the following message through [facility]. Routing to triage to assist patient with request.  [place], [facility]  Phone Number: [phone]  I believe I have another yeast infection - d/c and itch. Could you please send in an rx for Terazol 7?

## 2019-08-02 DIAGNOSIS — N183 Chronic kidney disease, stage 3 unspecified: Secondary | ICD-10-CM | POA: Diagnosis not present

## 2019-09-06 DIAGNOSIS — S43432A Superior glenoid labrum lesion of left shoulder, initial encounter: Secondary | ICD-10-CM | POA: Diagnosis not present

## 2019-09-06 DIAGNOSIS — M19012 Primary osteoarthritis, left shoulder: Secondary | ICD-10-CM | POA: Diagnosis not present

## 2019-09-07 DIAGNOSIS — M19012 Primary osteoarthritis, left shoulder: Secondary | ICD-10-CM | POA: Insufficient documentation

## 2019-09-07 DIAGNOSIS — S43432A Superior glenoid labrum lesion of left shoulder, initial encounter: Secondary | ICD-10-CM | POA: Insufficient documentation

## 2019-09-14 DIAGNOSIS — M24812 Other specific joint derangements of left shoulder, not elsewhere classified: Secondary | ICD-10-CM | POA: Diagnosis not present

## 2019-09-14 DIAGNOSIS — Z09 Encounter for follow-up examination after completed treatment for conditions other than malignant neoplasm: Secondary | ICD-10-CM | POA: Diagnosis not present

## 2019-10-01 DIAGNOSIS — M19012 Primary osteoarthritis, left shoulder: Secondary | ICD-10-CM | POA: Diagnosis not present

## 2019-10-01 DIAGNOSIS — M7542 Impingement syndrome of left shoulder: Secondary | ICD-10-CM | POA: Diagnosis not present

## 2019-10-01 DIAGNOSIS — M24112 Other articular cartilage disorders, left shoulder: Secondary | ICD-10-CM | POA: Diagnosis not present

## 2019-10-01 DIAGNOSIS — M75112 Incomplete rotator cuff tear or rupture of left shoulder, not specified as traumatic: Secondary | ICD-10-CM | POA: Diagnosis not present

## 2019-10-01 DIAGNOSIS — M7552 Bursitis of left shoulder: Secondary | ICD-10-CM | POA: Diagnosis not present

## 2019-10-01 DIAGNOSIS — G8918 Other acute postprocedural pain: Secondary | ICD-10-CM | POA: Diagnosis not present

## 2019-10-04 DIAGNOSIS — M67912 Unspecified disorder of synovium and tendon, left shoulder: Secondary | ICD-10-CM | POA: Diagnosis not present

## 2019-10-09 DIAGNOSIS — Z9889 Other specified postprocedural states: Secondary | ICD-10-CM | POA: Diagnosis not present

## 2019-10-09 DIAGNOSIS — M67912 Unspecified disorder of synovium and tendon, left shoulder: Secondary | ICD-10-CM | POA: Diagnosis not present

## 2019-10-11 DIAGNOSIS — M67912 Unspecified disorder of synovium and tendon, left shoulder: Secondary | ICD-10-CM | POA: Diagnosis not present

## 2019-10-16 DIAGNOSIS — M67912 Unspecified disorder of synovium and tendon, left shoulder: Secondary | ICD-10-CM | POA: Diagnosis not present

## 2019-10-18 DIAGNOSIS — M67912 Unspecified disorder of synovium and tendon, left shoulder: Secondary | ICD-10-CM | POA: Diagnosis not present

## 2019-10-21 DIAGNOSIS — M67912 Unspecified disorder of synovium and tendon, left shoulder: Secondary | ICD-10-CM | POA: Diagnosis not present

## 2019-10-24 DIAGNOSIS — M67912 Unspecified disorder of synovium and tendon, left shoulder: Secondary | ICD-10-CM | POA: Diagnosis not present

## 2019-10-28 DIAGNOSIS — M67912 Unspecified disorder of synovium and tendon, left shoulder: Secondary | ICD-10-CM | POA: Diagnosis not present

## 2019-10-31 DIAGNOSIS — N183 Chronic kidney disease, stage 3 unspecified: Secondary | ICD-10-CM | POA: Diagnosis not present

## 2019-10-31 DIAGNOSIS — M67912 Unspecified disorder of synovium and tendon, left shoulder: Secondary | ICD-10-CM | POA: Diagnosis not present

## 2019-11-05 DIAGNOSIS — M67912 Unspecified disorder of synovium and tendon, left shoulder: Secondary | ICD-10-CM | POA: Diagnosis not present

## 2019-11-07 DIAGNOSIS — Z1211 Encounter for screening for malignant neoplasm of colon: Secondary | ICD-10-CM | POA: Diagnosis not present

## 2019-11-07 DIAGNOSIS — R635 Abnormal weight gain: Secondary | ICD-10-CM | POA: Diagnosis not present

## 2019-11-07 DIAGNOSIS — M67912 Unspecified disorder of synovium and tendon, left shoulder: Secondary | ICD-10-CM | POA: Diagnosis not present

## 2019-11-07 DIAGNOSIS — Z86010 Personal history of colonic polyps: Secondary | ICD-10-CM | POA: Diagnosis not present

## 2019-11-14 ENCOUNTER — Ambulatory Visit (INDEPENDENT_AMBULATORY_CARE_PROVIDER_SITE_OTHER): Payer: BC Managed Care – PPO | Admitting: Pulmonary Disease

## 2019-11-14 ENCOUNTER — Other Ambulatory Visit (INDEPENDENT_AMBULATORY_CARE_PROVIDER_SITE_OTHER): Payer: BC Managed Care – PPO

## 2019-11-14 ENCOUNTER — Encounter: Payer: Self-pay | Admitting: Pulmonary Disease

## 2019-11-14 ENCOUNTER — Other Ambulatory Visit: Payer: Self-pay

## 2019-11-14 ENCOUNTER — Ambulatory Visit (INDEPENDENT_AMBULATORY_CARE_PROVIDER_SITE_OTHER): Payer: BC Managed Care – PPO

## 2019-11-14 VITALS — BP 126/70 | HR 77 | Temp 97.3°F | Ht 66.0 in | Wt 162.0 lb

## 2019-11-14 DIAGNOSIS — R0600 Dyspnea, unspecified: Secondary | ICD-10-CM

## 2019-11-14 DIAGNOSIS — R0602 Shortness of breath: Secondary | ICD-10-CM | POA: Diagnosis not present

## 2019-11-14 LAB — CBC WITH DIFFERENTIAL/PLATELET
Basophils Absolute: 0 10*3/uL (ref 0.0–0.1)
Basophils Relative: 0.4 % (ref 0.0–3.0)
Eosinophils Absolute: 0.1 10*3/uL (ref 0.0–0.7)
Eosinophils Relative: 1.5 % (ref 0.0–5.0)
HCT: 41.1 % (ref 36.0–46.0)
Hemoglobin: 13.9 g/dL (ref 12.0–15.0)
Lymphocytes Relative: 14.4 % (ref 12.0–46.0)
Lymphs Abs: 1.3 10*3/uL (ref 0.7–4.0)
MCHC: 33.8 g/dL (ref 30.0–36.0)
MCV: 94 fl (ref 78.0–100.0)
Monocytes Absolute: 0.4 10*3/uL (ref 0.1–1.0)
Monocytes Relative: 4.7 % (ref 3.0–12.0)
Neutro Abs: 7.3 10*3/uL (ref 1.4–7.7)
Neutrophils Relative %: 79 % — ABNORMAL HIGH (ref 43.0–77.0)
Platelets: 338 10*3/uL (ref 150.0–400.0)
RBC: 4.38 Mil/uL (ref 3.87–5.11)
RDW: 12.8 % (ref 11.5–15.5)
WBC: 9.3 10*3/uL (ref 4.0–10.5)

## 2019-11-14 MED ORDER — FLUTICASONE PROPIONATE 50 MCG/ACT NA SUSP: 2.0000 | Freq: Every day | NASAL | 2 refills | Status: DC

## 2019-11-14 NOTE — Patient Instructions (Signed)
We will schedule you for pulmonary function test and a chest x-ray for evaluation of your lung Check CBC with differential and IgE for baseline reevaluation of asthma  Follow-up in 1 to 2 months.

## 2019-11-14 NOTE — Progress Notes (Signed)
Donna King    [id number]    1955/10/09  Primary Care Physician:Gates, Herbie Baltimore, MD  Referring Physician: Josetta Huddle, MD [place]. 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 She was seen in 2006 for cough by Dr. Melvyn Novas. She had a Bronch in 2006 which showed abnormal mucus. She was sent to [facility] for evaluation of Primary Ciliary disease. She was evaluated and was told she did not have Primary Ciliary disease, she was worked up for NON-TB MAC which was also negative.  Additional history includes resting tachycardia, and hypertension since the age of 29.She has been on Beta Blockers for about 10 years. 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 She states that the room was not well ventilated.  Interim history: Last seen in clinic by me in 2017. She has been intermittently on Asmanex.  Has tried [place] in 2018 but had dry mouth  Restarted Asmanex in 2019 during the Covid pandemic. 2 to 3 weeks ago she had an episode of chest congestion and Asmanex was changed to Advair by her primary care Feeling better now back to baseline Also has significant nasal congestion and postnasal drip.  Outpatient Encounter Medications as of 11/14/2019  Medication Sig  . Cholecalciferol (VITAMIN D) 2000 units tablet Take 2,000 Units by mouth daily.  Marland Kitchen diltiazem (CARDIZEM CD) 120 MG 24 hr capsule Take 1 capsule (120 mg total) by mouth daily.  Marland Kitchen estradiol (VIVELLE-DOT) 0.05 MG/24HR patch Place 1 patch (0.05 mg total) onto the skin 2 (two) times a week.  . Fluticasone-Salmeterol (ADVAIR) 250-50 MCG/DOSE AEPB Inhale  1 puff into the lungs 2 (two) times daily.  Marland Kitchen ipratropium (ATROVENT) 0.03 % nasal spray ipratropium bromide 0.03 % nasal spray  . nebivolol (BYSTOLIC) 5 MG tablet Take 5 mg by mouth daily.  Marland Kitchen estradiol (ESTRACE) 0.1 MG/GM vaginal cream 1 gram vaginally twice weekly (Patient not taking: Reported on 11/14/2019)  . [DISCONTINUED] betamethasone valerate ointment (VALISONE) 0.1 % Use a pea sized amount topically BID for up to 2 weeks as needed.  . [DISCONTINUED] metroNIDAZOLE (METROGEL) 0.75 % vaginal gel Place 1 Applicatorful vaginally at bedtime. Use for 5 nights  . [DISCONTINUED] terconazole (TERAZOL 7) 0.4 % vaginal cream Place 1 applicator vaginally at bedtime. One applicator full QHS for seven days of therapy   No facility-administered encounter medications on file as of 11/14/2019.    Allergies as of 11/14/2019 - Review Complete 11/14/2019  Allergen Reaction Noted  . Blue dyes (parenteral) Rash and Other (See Comments) 10/15/2014  . Sulfa antibiotics Other (See Comments) 10/14/2014    Past Medical History:  Diagnosis Date  . Asthma   . Essential (primary) hypertension   . HTN (hypertension)   . RAD (reactive airway disease)     Past Surgical History:  Procedure Laterality Date  . AUGMENTATION MAMMAPLASTY    . BREAST BIOPSY     x3  . LAPAROSCOPIC HYSTERECTOMY      Family History  Problem Relation Age of Onset  . Thyroid disease Mother   . Heart disease Father   . Hypertension Sister   . Cancer Maternal  Grandmother        pancrease    Social History   Socioeconomic History  . Marital status: Married    Spouse name: Not on file  . Number of children: Not on file  . Years of education: Not on file  . Highest education level: Not on file  Occupational History  . Not on file  Tobacco Use  . Smoking status: Never Smoker  . Smokeless tobacco: Never Used  Substance and Sexual Activity  . Alcohol use: Yes    Alcohol/week: 14.0 standard drinks    Types: 14 Standard  drinks or equivalent per week  . Drug use: No  . Sexual activity: Yes    Partners: Male    Birth control/protection: Surgical  Other Topics Concern  . Not on file  Social History Narrative  . Not on file   Social Determinants of Health   Financial Resource Strain:   . Difficulty of Paying Living Expenses:   Food Insecurity:   . Worried About Charity fundraiser in the Last Year:   . Arboriculturist in the Last Year:   Transportation Needs:   . Film/video editor (Medical):   Marland Kitchen Lack of Transportation (Non-Medical):   Physical Activity:   . Days of Exercise per Week:   . Minutes of Exercise per Session:   Stress:   . Feeling of Stress :   Social Connections:   . Frequency of Communication with Friends and Family:   . Frequency of Social Gatherings with Friends and Family:   . Attends Religious Services:   . Active Member of Clubs or Organizations:   . Attends Archivist Meetings:   Marland Kitchen Marital Status:   Intimate Partner Violence:   . Fear of Current or Ex-Partner:   . Emotionally Abused:   Marland Kitchen Physically Abused:   . Sexually Abused:     Review of systems: Review of Systems  Constitutional: Negative for fever and chills.  HENT: Negative.   Eyes: Negative for blurred vision.  Respiratory: as per HPI  Cardiovascular: Negative for chest pain and palpitations.  Gastrointestinal: Negative for vomiting, diarrhea, blood per rectum. Genitourinary: Negative for dysuria, urgency, frequency and hematuria.  Musculoskeletal: Negative for myalgias, back pain and joint pain.  Skin: Negative for itching and rash.  Neurological: Negative for dizziness, tremors, focal weakness, seizures and loss of consciousness.  Endo/Heme/Allergies: Negative for environmental allergies.  Psychiatric/Behavioral: Negative for depression, suicidal ideas and hallucinations.  All other systems reviewed and are negative.  Physical Exam: Blood pressure 126/70, pulse 77, temperature (!) 97.3 F  (36.3 C), temperature source Temporal, height _0  (1.676 m), weight 162 lb (73.5 kg), SpO2 99 %. Gen:      No acute distress HEENT:  EOMI, sclera anicteric Neck:     No masses; no thyromegaly Lungs:    Clear to auscultation bilaterally; normal respiratory effort CV:         Regular rate and rhythm; no murmurs Abd:      + bowel sounds; soft, non-tender; no palpable masses, no distension Ext:    No edema; adequate peripheral perfusion Skin:      Warm and dry; no rash Neuro: alert and oriented x 3 Psych: normal mood and affect  Data Reviewed: Imaging: Chest x-ray 12/22/2015-hyperinflation.  I have reviewed the images personally  PFTs: PFTs 02/04/16 FVC 2.5 to [99%), FEV1 2.45 (89%), F/F 70, TLC 5.35 [94%], RV/TLC 122%, DLCO 70% Minimal obstructive disease, hyperinflation and air trapping.  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  Now back on Advair which is working for her Check PFTs, chest x-ray for baseline evaluation CBC differential, IgE  Plan/Recommendations: - Continue Advair - PFTs, chest x-ray - CBC with differential, IgE.  Marshell Garfinkel MD [facility] 11/14/2019, 11:30 AM  CC: Josetta Huddle, MD

## 2019-11-15 ENCOUNTER — Other Ambulatory Visit: Payer: Self-pay | Admitting: Pulmonary Disease

## 2019-11-15 DIAGNOSIS — R911 Solitary pulmonary nodule: Secondary | ICD-10-CM

## 2019-11-15 LAB — IGE: IgE (Immunoglobulin E), Serum: 6 kU/L (ref <=114)

## 2019-11-15 NOTE — Telephone Encounter (Signed)
Yes. I send a result message via Mychart and already ordered the CT  The nodule is vague appearing about 15 mm but hard to measure on CXR. We will get a better idea on size and mediastinal adenopathy on CT scan.

## 2019-11-15 NOTE — Progress Notes (Signed)
Order sent to [facility] 

## 2019-11-15 NOTE — Telephone Encounter (Signed)
Dr.Mannam please advise on patients mychart message:  So I see my CXR has a vague nodule.  I have a few questions - how big is it?  Is there mediastinal adenopathy? Are my lungs hyperinflated as they have been on previous films?  Should I have a CT?

## 2019-11-22 ENCOUNTER — Ambulatory Visit (INDEPENDENT_AMBULATORY_CARE_PROVIDER_SITE_OTHER)
Admission: RE | Admit: 2019-11-22 | Discharge: 2019-11-22 | Disposition: A | Discharge status: Home / Self Care | Payer: BC Managed Care – PPO | Source: Ambulatory Visit | Attending: Pulmonary Disease | Admitting: Pulmonary Disease

## 2019-11-22 ENCOUNTER — Other Ambulatory Visit: Payer: Self-pay

## 2019-11-22 DIAGNOSIS — R918 Other nonspecific abnormal finding of lung field: Secondary | ICD-10-CM | POA: Diagnosis not present

## 2019-11-22 DIAGNOSIS — R911 Solitary pulmonary nodule: Secondary | ICD-10-CM

## 2019-11-26 ENCOUNTER — Telehealth: Payer: Self-pay

## 2019-11-26 DIAGNOSIS — R918 Other nonspecific abnormal finding of lung field: Secondary | ICD-10-CM

## 2019-11-26 DIAGNOSIS — R0600 Dyspnea, unspecified: Secondary | ICD-10-CM

## 2019-11-26 DIAGNOSIS — R197 Diarrhea, unspecified: Secondary | ICD-10-CM | POA: Diagnosis not present

## 2019-11-26 DIAGNOSIS — J683 Other acute and subacute respiratory conditions due to chemicals, gases, fumes and vapors: Secondary | ICD-10-CM

## 2019-11-26 MED ORDER — SODIUM CHLORIDE 3 % IN NEBU: INHALATION_SOLUTION | Freq: Two times a day (BID) | RESPIRATORY_TRACT | 12 refills | Status: DC

## 2019-11-26 NOTE — Telephone Encounter (Signed)
I have placed an order for all per Dr. Isaiah Serge.

## 2019-11-26 NOTE — Telephone Encounter (Signed)
-----   Message from Chilton Greathouse, MD sent at 11/26/2019  8:57 AM EDT ----- Called and discussed results with patient.  Discussed bronchoscopy but patient does not want to do the procedure yet We will treat with flutter valve, inhaled hypertonic saline 4 mL twice daily and over-the-counter Mucinex.Please order the above and follow-up CT without contrast in 3 months.

## 2019-11-27 DIAGNOSIS — J683 Other acute and subacute respiratory conditions due to chemicals, gases, fumes and vapors: Secondary | ICD-10-CM | POA: Diagnosis not present

## 2019-11-29 DIAGNOSIS — J683 Other acute and subacute respiratory conditions due to chemicals, gases, fumes and vapors: Secondary | ICD-10-CM

## 2019-12-02 DIAGNOSIS — J683 Other acute and subacute respiratory conditions due to chemicals, gases, fumes and vapors: Secondary | ICD-10-CM | POA: Diagnosis not present

## 2019-12-13 ENCOUNTER — Other Ambulatory Visit (HOSPITAL_COMMUNITY)
Admission: RE | Admit: 2019-12-13 | Discharge: 2019-12-13 | Disposition: A | Discharge status: Home / Self Care | Payer: BC Managed Care – PPO | Source: Ambulatory Visit | Attending: Pulmonary Disease | Admitting: Pulmonary Disease

## 2019-12-13 DIAGNOSIS — Z20822 Contact with and (suspected) exposure to covid-19: Secondary | ICD-10-CM | POA: Diagnosis not present

## 2019-12-13 DIAGNOSIS — Z01812 Encounter for preprocedural laboratory examination: Secondary | ICD-10-CM | POA: Insufficient documentation

## 2019-12-13 LAB — SARS CORONAVIRUS 2 (TAT 6-24 HRS): SARS Coronavirus 2: NEGATIVE

## 2019-12-16 ENCOUNTER — Other Ambulatory Visit: Payer: Self-pay

## 2019-12-16 ENCOUNTER — Ambulatory Visit (INDEPENDENT_AMBULATORY_CARE_PROVIDER_SITE_OTHER): Payer: BC Managed Care – PPO | Admitting: Pulmonary Disease

## 2019-12-16 DIAGNOSIS — R0600 Dyspnea, unspecified: Secondary | ICD-10-CM

## 2019-12-16 LAB — PULMONARY FUNCTION TEST
DL/VA % pred: 88 %
DL/VA: 3.64 ml/min/mmHg/L
DLCO cor % pred: 84 %
DLCO cor: 18.11 ml/min/mmHg
DLCO unc % pred: 86 %
DLCO unc: 18.38 ml/min/mmHg
FEF 25-75 Post: 1.56 L/sec
FEF 25-75 Pre: 1.33 L/sec
FEF2575-%Change-Post: 17 %
FEF2575-%Pred-Post: 66 %
FEF2575-%Pred-Pre: 56 %
FEV1-%Change-Post: 4 %
FEV1-%Pred-Post: 90 %
FEV1-%Pred-Pre: 87 %
FEV1-Post: 2.42 L
FEV1-Pre: 2.32 L
FEV1FVC-%Change-Post: 0 %
FEV1FVC-%Pred-Pre: 90 %
FEV6-%Change-Post: 3 %
FEV6-%Pred-Post: 99 %
FEV6-%Pred-Pre: 96 %
FEV6-Post: 3.34 L
FEV6-Pre: 3.22 L
FEV6FVC-%Change-Post: 0 %
FEV6FVC-%Pred-Post: 101 %
FEV6FVC-%Pred-Pre: 101 %
FVC-%Change-Post: 3 %
FVC-%Pred-Post: 98 %
FVC-%Pred-Pre: 95 %
FVC-Post: 3.42 L
FVC-Pre: 3.31 L
Post FEV1/FVC ratio: 71 %
Post FEV6/FVC ratio: 97 %
Pre FEV1/FVC ratio: 70 %
Pre FEV6/FVC Ratio: 97 %
RV % pred: 134 %
RV: 2.89 L
TLC % pred: 112 %
TLC: 6.01 L

## 2019-12-16 NOTE — Progress Notes (Signed)
PFT done today. 

## 2019-12-17 ENCOUNTER — Encounter: Payer: Self-pay | Admitting: Plastic Surgery

## 2019-12-17 ENCOUNTER — Encounter: Payer: Self-pay | Admitting: Pulmonary Disease

## 2019-12-17 ENCOUNTER — Ambulatory Visit (INDEPENDENT_AMBULATORY_CARE_PROVIDER_SITE_OTHER): Payer: BC Managed Care – PPO | Admitting: Pulmonary Disease

## 2019-12-17 ENCOUNTER — Ambulatory Visit (INDEPENDENT_AMBULATORY_CARE_PROVIDER_SITE_OTHER): Payer: Self-pay | Admitting: Plastic Surgery

## 2019-12-17 VITALS — BP 116/66 | HR 73 | Temp 98.3°F | Ht 66.0 in | Wt 160.2 lb

## 2019-12-17 DIAGNOSIS — Z8679 Personal history of other diseases of the circulatory system: Secondary | ICD-10-CM | POA: Diagnosis not present

## 2019-12-17 DIAGNOSIS — Z719 Counseling, unspecified: Secondary | ICD-10-CM

## 2019-12-17 DIAGNOSIS — R0600 Dyspnea, unspecified: Secondary | ICD-10-CM | POA: Diagnosis not present

## 2019-12-17 MED ORDER — ANORO ELLIPTA 62.5-25 MCG/INH IN AEPB: 1.0000 | INHALATION_SPRAY | Freq: Every day | RESPIRATORY_TRACT | 0 refills | Status: DC

## 2019-12-17 MED ORDER — ANORO ELLIPTA 62.5-25 MCG/INH IN AEPB: 1.0000 | INHALATION_SPRAY | Freq: Every day | RESPIRATORY_TRACT | 2 refills | Status: DC

## 2019-12-17 NOTE — Patient Instructions (Addendum)
We will check alpha-1 antitrypsin levels and phenotype Stop advair and we will start a LABA/LAMA inhaler with Anoro Follow-up CT scan which is already been ordered We will make referral to Dr. Andreas Ohm, cardiology for evaluation of coronary atherosclerosis.  Follow up in clinic in 3 months

## 2019-12-17 NOTE — Addendum Note (Signed)
Addended by: Jacquiline Doe on: 12/17/2019 03:36 PM   Modules accepted: Orders

## 2019-12-17 NOTE — Progress Notes (Signed)
DANNAE KATO    [id number]    01/11/1956  Primary Care Physician:Gates, Herbie Baltimore, MD  Referring Physician: Josetta Huddle, MD [place]. 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 She was seen in 2006 for cough by Dr. Melvyn Novas. She had a Bronch in 2006 which showed abnormal mucus. She was sent to [facility] for evaluation of Primary Ciliary disease. She was evaluated and was told she did not have Primary Ciliary disease, she was worked up for NON-TB MAC which was also negative.  Additional history includes resting tachycardia, and hypertension since the age of 25.She has been on Beta Blockers for about 10 years. 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 She states that the room was not well ventilated.  She has been intermittently on Asmanex, Tudorza and Advair  Interim history: Here for review of CT scan and pulmonary function test.  States that breathing is doing well with no issues.  Outpatient Encounter Medications as of 12/17/2019  Medication Sig  . Cholecalciferol (VITAMIN D) 2000 units tablet Take 2,000 Units by mouth daily.  Marland Kitchen estradiol (ESTRACE) 0.1 MG/GM vaginal cream 1 gram vaginally twice weekly  . estradiol (VIVELLE-DOT) 0.05 MG/24HR patch Place 1 patch (0.05 mg total) onto the skin 2 (two) times a week.  . fluticasone (FLONASE) 50 MCG/ACT nasal spray Place 2 sprays into both nostrils daily.  . Fluticasone-Salmeterol (ADVAIR) 250-50 MCG/DOSE AEPB Inhale 1 puff into the lungs 2 (two) times daily.  Marland Kitchen ipratropium (ATROVENT) 0.03 % nasal spray ipratropium bromide 0.03 % nasal spray  .  nebivolol (BYSTOLIC) 5 MG tablet Take 5 mg by mouth daily.  . sodium chloride HYPERTONIC 3 % nebulizer solution Take by nebulization 2 (two) times daily.  Marland Kitchen diltiazem (CARDIZEM CD) 120 MG 24 hr capsule Take 1 capsule (120 mg total) by mouth daily.   No facility-administered encounter medications on file as of 12/17/2019.   Physical Exam: Blood pressure 116/66, pulse 73, temperature 98.3 F (36.8 C), temperature source Oral, height _0  (1.676 m), weight 160 lb 3.2 oz (72.7 kg), SpO2 99 %. Gen:      No acute distress HEENT:  EOMI, sclera anicteric Neck:     No masses; no thyromegaly Lungs:    Clear to auscultation bilaterally; normal respiratory effort CV:         Regular rate and rhythm; no murmurs Abd:      + bowel sounds; soft, non-tender; no palpable masses, no distension Ext:    No edema; adequate peripheral perfusion Skin:      Warm and dry; no rash Neuro: alert and oriented x 3 Psych: normal mood and affect  Data Reviewed: Imaging: Chest x-ray 12/22/2015-hyperinflation.  CT chest 11/22/2019-multifocal reticulonodular opacities bilaterally, largest pulmonary nodule measuring 1.4 cm in the right upper lobe.  Coronary, aortic atherosclerosis I have reviewed the images personally  PFTs: 02/04/16 FVC 2.5 to [99%), FEV1 2.45 (89%), F/F 70, TLC 5.35 [94%], RV/TLC 122%, DLCO 70% Mild obstructive disease, hyperinflation and air trapping.  12/16/2019 FVC 3.42 [98%], FEV1 2.42 [90%], F/F 71, TLC 6.01 (1%), DLCO 18.38 [86%] Mild obstruction.  Labs:  CBC 11/14/2019- WBC 9.3, eos 1.5%, absolute eosinophil count 139 IgE 11/14/2019- 6  Alpha-1 antitrypsin 08/08/2016 (from primary)- 102  Assessment:  Mild emphysema, asthmatic bronchitis Presumed secondary to formaldehyde exposure.  She does not have any evidence of T2 inflammation with normal eosinophils and IgE. I do not believe she will need to be on inhaled corticosteroid.  We will stop the Advair and switch her to LAMA/LABA inhaler with  Anoro  Abnormal CT with lung nodules She has been evaluated in the past with bronchoscopy showing negative AFB. Reviewed the most recent scan with her we have agreed to defer regular bronchoscopy for now and repeat CT scan in 3 months.  Coronary, aortic atherosclerosis Referral to cardiology for further evaluation.  Plan/Recommendations: - Continue Advair - PFTs, chest x-ray - CBC with differential, IgE.  Marshell Garfinkel MD [facility] 12/17/2019, 2:59 PM  CC: Josetta Huddle, MD

## 2019-12-17 NOTE — Progress Notes (Signed)
Patient ID: Donna King, female    DOB: 1956/06/25, 64 y.o.   MRN: [id number]   Chief Complaint  Patient presents with  . Consult    blepharoplasty upper lid with possible partial face lift    The patient is a very nice 64 year old lady here for evaluation of her face and seeking information for facial rejuvenation.  She is a retired Estate agent.  She is interested in rejuvenation for her neck.  She is open to the possibility of a facelift but does not want it pulled tight to look abnormal.  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  She has a seborrheic keratosis with hyperpigmentation on her left cheek that she would like to have removed.  She has a little bit of gelling but overall very nice texture to her face and looks very good for her age.  She does not have excess fat in the submental area or neck.  She has done lasers in the past but no facial surgery.   Review of Systems  Constitutional: Negative.   HENT: Negative.   Eyes: Negative.   Respiratory: Negative.   Cardiovascular: Negative.   Gastrointestinal: Negative.   Musculoskeletal: Negative.     Past Medical History:  Diagnosis Date  . Asthma   . Essential (primary) hypertension   . HTN (hypertension)   . RAD (reactive airway disease)     Past Surgical History:  Procedure Laterality Date  . AUGMENTATION MAMMAPLASTY    . BREAST BIOPSY     x3  . LAPAROSCOPIC HYSTERECTOMY        Current Outpatient Medications:  .  Cholecalciferol (VITAMIN D) 2000 units tablet, Take 2,000 Units by mouth daily., Disp: , Rfl:  .  estradiol (ESTRACE) 0.1 MG/GM vaginal cream, 1 gram vaginally twice weekly, Disp: 42.5 g, Rfl: 1 .  estradiol (VIVELLE-DOT) 0.05 MG/24HR patch, Place 1 patch (0.05 mg total) onto the skin 2 (two) times a week., Disp: 24 patch, Rfl: 3 .  fluticasone (FLONASE) 50 MCG/ACT nasal spray, Place 2 sprays into both nostrils daily., Disp: 16 g, Rfl: 2 .   Fluticasone-Salmeterol (ADVAIR) 250-50 MCG/DOSE AEPB, Inhale 1 puff into the lungs 2 (two) times daily., Disp: , Rfl:  .  ipratropium (ATROVENT) 0.03 % nasal spray, ipratropium bromide 0.03 % nasal spray, Disp: , Rfl:  .  nebivolol (BYSTOLIC) 5 MG tablet, Take 5 mg by mouth daily., Disp: , Rfl:  .  sodium chloride HYPERTONIC 3 % nebulizer solution, Take by nebulization 2 (two) times daily., Disp: 750 mL, Rfl: 12 .  diltiazem (CARDIZEM CD) 120 MG 24 hr capsule, Take 1 capsule (120 mg total) by mouth daily., Disp: 90 capsule, Rfl: 3   Objective:   Vitals:   12/17/19 1028  BP: 132/83  Pulse: 80  Temp: (!) 97.5 F (36.4 C)  SpO2: 98%    Physical Exam Vitals and nursing note reviewed.  Constitutional:      Appearance: Normal appearance.  HENT:     Head: Normocephalic and atraumatic.  Cardiovascular:     Rate and Rhythm: Normal rate.     Pulses: Normal pulses.  Pulmonary:     Effort: Pulmonary effort is normal.  Abdominal:     General: Abdomen is flat. There is no distension.  Skin:    General: Skin is warm.     Capillary Refill: Capillary refill takes less than 2 seconds.  Neurological:     General: No  focal deficit present.     Mental Status: She is alert and oriented to person, place, and time.  Psychiatric:        Mood and Affect: Mood normal.        Behavior: Behavior normal.     Assessment & Plan:  Encounter for counseling  We discussed the possibility of a face and neck lift.  She is also interested in a upper lid blepharoplasty and we talked about how it would not change her brow position.  She is a good candidate for NdYAG laser for her hemangiomas on the abdomen.  We talked about the [facility] neck cream and dermal repair cream.  She would like to wait on that.  We can excise the seborrheic keratosis if she would like.    She would like a quote for the above information. Pictures were obtained of the patient and placed in the chart with the patient's or guardian's  permission.   [place], DO

## 2019-12-20 ENCOUNTER — Encounter: Payer: Self-pay | Admitting: Obstetrics and Gynecology

## 2019-12-20 DIAGNOSIS — Z1211 Encounter for screening for malignant neoplasm of colon: Secondary | ICD-10-CM | POA: Diagnosis not present

## 2019-12-30 ENCOUNTER — Encounter: Payer: Self-pay | Admitting: Plastic Surgery

## 2019-12-30 ENCOUNTER — Ambulatory Visit (INDEPENDENT_AMBULATORY_CARE_PROVIDER_SITE_OTHER): Payer: Self-pay | Admitting: Plastic Surgery

## 2019-12-30 ENCOUNTER — Other Ambulatory Visit: Payer: Self-pay

## 2019-12-30 ENCOUNTER — Other Ambulatory Visit: Payer: BC Managed Care – PPO | Admitting: Plastic Surgery

## 2019-12-30 VITALS — BP 131/83 | HR 77 | Temp 97.5°F | Ht 66.0 in | Wt 158.0 lb

## 2019-12-30 DIAGNOSIS — Z719 Counseling, unspecified: Secondary | ICD-10-CM

## 2019-12-30 NOTE — Progress Notes (Signed)
Preoperative Dx: Hyperpigmentation of hands and multiple hemangiomas  Postoperative Dx:  same  Procedure: laser to hands and abdomen / back  Anesthesia: none  Description of Procedure:  Risks and complications were explained to the patient. Consent was confirmed and signed. Time out was called and all information was confirmed to be correct. The area  area was prepped with alcohol and wiped dry. The NdYag laser was set at 250. The hemangiomas of the abdomen and back were lasered.  The laser was then set at the IPL setting.  Using 6.6 J/cm the hands were lasered for hyperpigmentation.  The patient tolerated the procedure well and there were no complications. The patient is to follow up in 4 weeks.

## 2020-01-14 DIAGNOSIS — H25813 Combined forms of age-related cataract, bilateral: Secondary | ICD-10-CM | POA: Diagnosis not present

## 2020-01-31 DIAGNOSIS — R9389 Abnormal findings on diagnostic imaging of other specified body structures: Secondary | ICD-10-CM | POA: Diagnosis not present

## 2020-01-31 DIAGNOSIS — Z8701 Personal history of pneumonia (recurrent): Secondary | ICD-10-CM | POA: Diagnosis not present

## 2020-01-31 DIAGNOSIS — J189 Pneumonia, unspecified organism: Secondary | ICD-10-CM | POA: Diagnosis not present

## 2020-02-25 DIAGNOSIS — L814 Other melanin hyperpigmentation: Secondary | ICD-10-CM | POA: Diagnosis not present

## 2020-02-25 DIAGNOSIS — L820 Inflamed seborrheic keratosis: Secondary | ICD-10-CM | POA: Diagnosis not present

## 2020-02-25 DIAGNOSIS — D2271 Melanocytic nevi of right lower limb, including hip: Secondary | ICD-10-CM | POA: Diagnosis not present

## 2020-02-25 DIAGNOSIS — D225 Melanocytic nevi of trunk: Secondary | ICD-10-CM | POA: Diagnosis not present

## 2020-02-25 DIAGNOSIS — L821 Other seborrheic keratosis: Secondary | ICD-10-CM | POA: Diagnosis not present

## 2020-03-02 ENCOUNTER — Ambulatory Visit (INDEPENDENT_AMBULATORY_CARE_PROVIDER_SITE_OTHER): Payer: BC Managed Care – PPO | Admitting: Obstetrics and Gynecology

## 2020-03-02 ENCOUNTER — Other Ambulatory Visit: Payer: Self-pay

## 2020-03-02 ENCOUNTER — Encounter: Payer: Self-pay | Admitting: Obstetrics and Gynecology

## 2020-03-02 VITALS — BP 132/64 | HR 78 | Ht 65.0 in | Wt 158.2 lb

## 2020-03-02 DIAGNOSIS — Z9189 Other specified personal risk factors, not elsewhere classified: Secondary | ICD-10-CM | POA: Diagnosis not present

## 2020-03-02 DIAGNOSIS — Z01419 Encounter for gynecological examination (general) (routine) without abnormal findings: Secondary | ICD-10-CM

## 2020-03-02 DIAGNOSIS — Z79890 Hormone replacement therapy (postmenopausal): Secondary | ICD-10-CM

## 2020-03-02 DIAGNOSIS — L293 Anogenital pruritus, unspecified: Secondary | ICD-10-CM | POA: Diagnosis not present

## 2020-03-02 DIAGNOSIS — L439 Lichen planus, unspecified: Secondary | ICD-10-CM | POA: Insufficient documentation

## 2020-03-02 DIAGNOSIS — Z5181 Encounter for therapeutic drug level monitoring: Secondary | ICD-10-CM

## 2020-03-02 DIAGNOSIS — J4520 Mild intermittent asthma, uncomplicated: Secondary | ICD-10-CM | POA: Insufficient documentation

## 2020-03-02 MED ORDER — ESTRADIOL 0.05 MG/24HR TD PTTW: 1.0000 | MEDICATED_PATCH | TRANSDERMAL | 3 refills | Status: DC

## 2020-03-02 MED ORDER — ESTRADIOL 0.1 MG/GM VA CREA: TOPICAL_CREAM | VAGINAL | 1 refills | Status: DC

## 2020-03-02 NOTE — Progress Notes (Addendum)
64 y.o. [id number] Married White or Caucasian Not Hispanic or Latino female here for annual exam.  She is getting recurrent yeast infections after sex. She thinks she may have an infection now. C/O mild itching.  No dyspareunia.   Elevated risk of breast cancer of 24.6%. She has concerns about breast MRI's and the risk with contrast.   Wants to stay on ERT, aware of the risks.   She has a pulmonary nodule being followed at [facility]. 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 No treatment needed unless symptomatic. She has another CT next week.     No LMP recorded. Patient has had a hysterectomy.          Sexually active: Yes.    The current method of family planning is status post hysterectomy.    Exercising: Yes.    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 F/U in 7 years. 2017 polyps removed f/u 3-5 years  TDaP:  02/28/19 Gardasil: NA   reports that she has never smoked. She has never used smokeless tobacco. She reports current alcohol use of about 14.0 standard drinks of alcohol per week. She reports that she does not use drugs. 2 grown sons, one with his PhD in [employer], other has his masters in Lobbyist.   Past Medical History:  Diagnosis Date  . Asthma   . Essential (primary) hypertension   . HTN (hypertension)   . RAD (reactive airway disease)     Past Surgical History:  Procedure Laterality Date  . AUGMENTATION MAMMAPLASTY    . BREAST BIOPSY     x3  . LAPAROSCOPIC HYSTERECTOMY    . SHOULDER ARTHROSCOPY Bilateral 11/20 [address]2637    Current Outpatient Medications  Medication Sig Dispense Refill  . Cholecalciferol (VITAMIN D) 2000 units tablet Take 2,000 Units by mouth daily.    Marland Kitchen estradiol (ESTRACE) 0.1 MG/GM vaginal cream 1 gram vaginally twice weekly  42.5 g 1  . estradiol (VIVELLE-DOT) 0.05 MG/24HR patch Place 1 patch (0.05 mg total) onto the skin 2 (two) times a week. 24 patch 3  . fluticasone (FLONASE) 50 MCG/ACT nasal spray Place 2 sprays into both nostrils daily. 16 g 2  . Fluticasone-Salmeterol (ADVAIR) 250-50 MCG/DOSE AEPB Inhale 1 puff into the lungs 2 (two) times daily.    Marland Kitchen ipratropium (ATROVENT) 0.03 % nasal spray ipratropium bromide 0.03 % nasal spray    . nebivolol (BYSTOLIC) 5 MG tablet Take 5 mg by mouth daily.    Marland Kitchen augmented betamethasone dipropionate (DIPROLENE-AF) 0.05 % cream Apply 1 application topically 2 (two) times daily as needed.     No current facility-administered medications for this visit.    Family History  Problem Relation Age of Onset  . Thyroid disease Mother   . Heart disease Father   . Hypertension Sister   . Cancer Maternal Grandmother        pancrease    Review of Systems  HENT: Negative for ear pain.   All other systems reviewed and are negative.   Exam:   BP 132/64   Pulse 78   Ht 5\' 5"  (1.651 m)   Wt 158 lb 3.2 oz (71.8 kg)   SpO2 98%   BMI 26.33 kg/m   Weight change: @WEIGHTCHANGE @ Height:  Height: 5\' 5"  (165.1 cm)  Ht Readings from Last 3 Encounters:  03/02/20 5\' 5"  (1.651 m)  12/30/19 5\' 6"  (1.676 m)  12/17/19 5\' 6"  (1.676 m)    General appearance: alert, cooperative and appears stated age Head: Normocephalic, without obvious abnormality, atraumatic Neck: no adenopathy, supple, symmetrical, trachea midline and thyroid normal to inspection and palpation Lungs: clear to auscultation bilaterally Cardiovascular: regular rate and rhythm Breasts: normal appearance, no masses or tenderness, bilateral implants Abdomen: soft, non-tender; non distended,  no masses,  no organomegaly Extremities: extremities normal, atraumatic, no cyanosis or edema Skin: Skin color, texture, turgor normal. No rashes or lesions Lymph nodes: Cervical, supraclavicular, and axillary nodes normal. No  abnormal inguinal nodes palpated Neurologic: Grossly normal   Pelvic: External genitalia:  no lesions              Urethra:  normal appearing urethra with no masses, tenderness or lesions              Bartholins and Skenes: normal                 Vagina: normal appearing vagina with normal color and discharge, no lesions              Cervix: absent               Bimanual Exam:  Uterus:  uterus absent              Adnexa: no mass, fullness, tenderness               Rectovaginal: Confirms               Anus:  normal sphincter tone, no lesions  02/29/20 chaperoned for the exam.  Wet prep: no clue, no trich, few wbc KOH: no yeast PH: 4   A:  Well Woman with normal exam  Elevated risk of breast cancer  Recurrent vaginitis symptoms.   P:   No pap needed  Mammogram in 9/21  Colonoscopy UTD  Continue ERT and vaginal estrogen  Send nuswab for BV and yeast

## 2020-03-02 NOTE — Patient Instructions (Signed)

## 2020-03-04 LAB — NUSWAB BV AND CANDIDA, NAA
Candida albicans, NAA: NEGATIVE
Candida glabrata, NAA: NEGATIVE

## 2020-03-09 ENCOUNTER — Ambulatory Visit (INDEPENDENT_AMBULATORY_CARE_PROVIDER_SITE_OTHER)
Admission: RE | Admit: 2020-03-09 | Discharge: 2020-03-09 | Disposition: A | Discharge status: Home / Self Care | Payer: BC Managed Care – PPO | Source: Ambulatory Visit | Attending: Pulmonary Disease | Admitting: Pulmonary Disease

## 2020-03-09 ENCOUNTER — Other Ambulatory Visit: Payer: Self-pay

## 2020-03-09 DIAGNOSIS — R918 Other nonspecific abnormal finding of lung field: Secondary | ICD-10-CM | POA: Diagnosis not present

## 2020-03-09 DIAGNOSIS — J8410 Pulmonary fibrosis, unspecified: Secondary | ICD-10-CM | POA: Diagnosis not present

## 2020-03-09 DIAGNOSIS — I700 Atherosclerosis of aorta: Secondary | ICD-10-CM | POA: Diagnosis not present

## 2020-03-09 DIAGNOSIS — J9811 Atelectasis: Secondary | ICD-10-CM | POA: Diagnosis not present

## 2020-03-09 DIAGNOSIS — I2510 Atherosclerotic heart disease of native coronary artery without angina pectoris: Secondary | ICD-10-CM | POA: Diagnosis not present

## 2020-03-10 ENCOUNTER — Other Ambulatory Visit: Payer: Self-pay | Admitting: Obstetrics and Gynecology

## 2020-03-10 DIAGNOSIS — I10 Essential (primary) hypertension: Secondary | ICD-10-CM | POA: Diagnosis not present

## 2020-03-10 DIAGNOSIS — E559 Vitamin D deficiency, unspecified: Secondary | ICD-10-CM | POA: Diagnosis not present

## 2020-03-10 DIAGNOSIS — A310 Pulmonary mycobacterial infection: Secondary | ICD-10-CM | POA: Diagnosis not present

## 2020-03-10 DIAGNOSIS — I2510 Atherosclerotic heart disease of native coronary artery without angina pectoris: Secondary | ICD-10-CM | POA: Diagnosis not present

## 2020-03-10 DIAGNOSIS — Z0000 Encounter for general adult medical examination without abnormal findings: Secondary | ICD-10-CM | POA: Diagnosis not present

## 2020-03-10 DIAGNOSIS — J449 Chronic obstructive pulmonary disease, unspecified: Secondary | ICD-10-CM | POA: Diagnosis not present

## 2020-03-10 DIAGNOSIS — Z1231 Encounter for screening mammogram for malignant neoplasm of breast: Secondary | ICD-10-CM

## 2020-03-10 DIAGNOSIS — E7800 Pure hypercholesterolemia, unspecified: Secondary | ICD-10-CM | POA: Diagnosis not present

## 2020-03-10 NOTE — Telephone Encounter (Signed)
Dr. Vaughan Browner please advise on patient email:   I saw the CT report - I wouldn't think MAC would improve when all I did was start on Advair, especially in so short an interval since my last CT.  That makes me question the diagnosis.  Could you please ask the radiologist if he saw tree-in-bud on the CT?  And maybe ask him to review the last one, too, and see if tree-in-bud was visualized on it?    Thank you!

## 2020-03-17 ENCOUNTER — Encounter: Payer: Self-pay | Admitting: Pulmonary Disease

## 2020-03-17 ENCOUNTER — Other Ambulatory Visit: Payer: Self-pay

## 2020-03-17 ENCOUNTER — Ambulatory Visit (INDEPENDENT_AMBULATORY_CARE_PROVIDER_SITE_OTHER): Payer: BC Managed Care – PPO | Admitting: Pulmonary Disease

## 2020-03-17 VITALS — BP 130/82 | HR 80 | Temp 96.0°F | Ht 66.0 in | Wt 156.4 lb

## 2020-03-17 DIAGNOSIS — R0600 Dyspnea, unspecified: Secondary | ICD-10-CM | POA: Diagnosis not present

## 2020-03-17 DIAGNOSIS — R918 Other nonspecific abnormal finding of lung field: Secondary | ICD-10-CM | POA: Diagnosis not present

## 2020-03-17 DIAGNOSIS — J683 Other acute and subacute respiratory conditions due to chemicals, gases, fumes and vapors: Secondary | ICD-10-CM | POA: Diagnosis not present

## 2020-03-17 NOTE — Progress Notes (Signed)
Donna King    [id number]    [birth year]  Primary Care Physician:Gates, Herbie [place], MD  Referring Physician: Josetta Huddle, MD [place]. 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 She was seen in 2006 for cough by Dr. Melvyn Novas. She had a Bronch in 2006 which showed abnormal mucus. She was sent to [facility] for evaluation of Primary Ciliary disease. She was evaluated and was told she did not have Primary Ciliary disease, she was worked up for NON-TB MAC which was also negative.  Additional history includes resting tachycardia, and hypertension since the age of 21.She has been on Beta Blockers for about 10 years. 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 She states that the room was not well ventilated.  She has been intermittently on Asmanex, Tudorza and Advair  Interim history: Being followed up for CT which shows reticulonodular opacities. She got a second opinion at [facility] with Dr. Girard Cooter who agrees that this may be MAI but no treatment recommended as it is mild and patient is asymptomatic.  She also had quantitative hemoglobins done at [facility] which were normal.  She took herself off inhalers but had worsening cough and hence restarted Advair with improvement.  Outpatient Encounter Medications as of 03/17/2020  Medication Sig  . augmented betamethasone dipropionate (DIPROLENE-AF) 0.05 % cream Apply 1 application topically 2 (two) times daily as needed.  . Cholecalciferol (VITAMIN D) 2000 units tablet Take 2,000 Units by mouth daily.  Marland Kitchen estradiol  (ESTRACE) 0.1 MG/GM vaginal cream 1 gram vaginally twice weekly  . estradiol (VIVELLE-DOT) 0.05 MG/24HR patch Place 1 patch (0.05 mg total) onto the skin 2 (two) times a week.  . fluticasone (FLONASE) 50 MCG/ACT nasal spray Place 2 sprays into both nostrils daily.  . Fluticasone-Salmeterol (ADVAIR) 250-50 MCG/DOSE AEPB Inhale 1 puff into the lungs 2 (two) times daily.  Marland Kitchen ipratropium (ATROVENT) 0.03 % nasal spray ipratropium bromide 0.03 % nasal spray  . nebivolol (BYSTOLIC) 5 MG tablet Take 5 mg by mouth daily.   No facility-administered encounter medications on file as of 03/17/2020.   Physical Exam: Blood pressure 130/82, pulse 80, temperature (!) 96 F (35.6 C), temperature source Other (Comment), height _0  (1.676 m), weight 156 lb 6.4 oz (70.9 kg), SpO2 98 %. Gen:      No acute distress HEENT:  EOMI, sclera anicteric Neck:     No masses; no thyromegaly Lungs:    Clear to auscultation bilaterally; normal respiratory effort CV:         Regular rate and rhythm; no murmurs Abd:      + bowel sounds; soft, non-tender; no palpable masses, no distension Ext:    No edema; adequate peripheral perfusion Skin:      Warm and dry; no rash Neuro: alert and oriented x 3 Psych: normal mood and affect  Data Reviewed: Imaging: Chest x-ray 12/22/2015-hyperinflation.  CT chest 11/22/2019-multifocal reticulonodular opacities bilaterally, largest pulmonary nodule measuring 1.4 cm in the right upper lobe.  Coronary, aortic atherosclerosis CT chest 03/09/2020-improving peribronchovascular nodularity. I have reviewed the images personally.  PFTs: 02/04/16  FVC 2.5 to [99%), FEV1 2.45 (89%), F/F 70, TLC 5.35 [94%], RV/TLC 122%, DLCO 70% Mild obstructive disease, hyperinflation and air trapping.  12/16/2019 FVC 3.42 [98%], FEV1 2.42 [90%], F/F 71, TLC 6.01 (1%), DLCO 18.38 [86%] Mild obstruction.  Labs: CBC 11/14/2019- WBC 9.3, eos 1.5%, absolute eosinophil count 139 IgE 11/14/2019- 6  Alpha-1  antitrypsin 08/08/2016 (from primary)- 102  Assessment:  Mild emphysema, asthmatic bronchitis Presumed secondary to formaldehyde exposure.  She does not have any evidence of T2 inflammation with normal eosinophils and IgE. Did not tolerate Anoro or Spiriva. She feels that Advair works best for her and is back on it  Abnormal CT with lung nodules She has been evaluated in the past with bronchoscopy showing negative AFB. CT continues to show waxing and waning infiltrates. Quantitative no globulins at [facility] is normal Discussed with patient in detail today.  We have agreed that we would not treat aggressively for MAI.  Review of scans at ILD conference  Coronary, aortic atherosclerosis She has been referred to cardiology for further evaluation  Plan/Recommendations: - Continue Advair - Follow-up in 6 months - Review scans at ILD conference  Marshell Garfinkel MD [facility] 03/17/2020, 12:29 PM  CC: Josetta Huddle, MD

## 2020-03-17 NOTE — Patient Instructions (Signed)
I have reviewed your scans which show improvement in inflammation noted on previous scan We will continue to monitor I will review your scan with radiology colleagues to get their opinion Follow-up in 6 months.

## 2020-03-26 ENCOUNTER — Other Ambulatory Visit: Payer: Self-pay

## 2020-03-26 ENCOUNTER — Ambulatory Visit
Admission: RE | Admit: 2020-03-26 | Discharge: 2020-03-26 | Disposition: A | Discharge status: Home / Self Care | Payer: BC Managed Care – PPO | Source: Ambulatory Visit

## 2020-03-26 DIAGNOSIS — Z1231 Encounter for screening mammogram for malignant neoplasm of breast: Secondary | ICD-10-CM

## 2020-04-13 ENCOUNTER — Ambulatory Visit
Admission: RE | Admit: 2020-04-13 | Discharge: 2020-04-13 | Disposition: A | Discharge status: Home / Self Care | Payer: BC Managed Care – PPO | Source: Ambulatory Visit | Attending: Obstetrics and Gynecology | Admitting: Obstetrics and Gynecology

## 2020-04-13 ENCOUNTER — Other Ambulatory Visit: Payer: Self-pay

## 2020-04-13 DIAGNOSIS — Z1231 Encounter for screening mammogram for malignant neoplasm of breast: Secondary | ICD-10-CM | POA: Diagnosis not present

## 2020-04-14 ENCOUNTER — Encounter: Payer: Self-pay | Admitting: Cardiology

## 2020-04-14 ENCOUNTER — Ambulatory Visit (INDEPENDENT_AMBULATORY_CARE_PROVIDER_SITE_OTHER): Payer: BC Managed Care – PPO | Admitting: Cardiology

## 2020-04-14 VITALS — BP 124/80 | HR 72 | Ht 66.0 in | Wt 155.4 lb

## 2020-04-14 DIAGNOSIS — E785 Hyperlipidemia, unspecified: Secondary | ICD-10-CM | POA: Diagnosis not present

## 2020-04-14 DIAGNOSIS — I2510 Atherosclerotic heart disease of native coronary artery without angina pectoris: Secondary | ICD-10-CM

## 2020-04-14 DIAGNOSIS — I10 Essential (primary) hypertension: Secondary | ICD-10-CM | POA: Diagnosis not present

## 2020-04-14 MED ORDER — ROSUVASTATIN CALCIUM 5 MG PO TABS: 5.0000 mg | ORAL_TABLET | Freq: Every day | ORAL | 3 refills | Status: DC

## 2020-04-14 NOTE — Progress Notes (Signed)
Cardiology Office Note:    Date:  04/14/2020   ID:  Donna King, Donna King 12-22-1955, MRN [id number]  PCP:  Josetta Huddle, MD  [place] Cardiologist:  No primary care provider on file.  [facility] Electrophysiologist:  None   Referring MD: Marshell Garfinkel, MD   Reason for visit: Coronary calcifications and hyperlipidemia  History of Present Illness:    Donna King is a 64 y.o. female with a hx of hyperlipidemia, hypertension and newly diagnosed coronary calcifications that were incidental finding on noncontrast chest CT performed for evaluation of shortness of breath.  She was diagnosed with mild emphysema, asthmatic bronchitis, possibly secondary to formaldehyde exposure, as well as abnormal lung nodules that are negative for AFB with possible MAI.  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  She denies any symptoms of chest pain or shortness of breath with any activity, she denies any palpitations, dizziness, no lower extremity edema.  She has never smoked.  Her father had MI and CABG in his 25s.  She has never been on lipid-lowering therapy.  Past Medical History:  Diagnosis Date  . Asthma   . Essential (primary) hypertension   . HTN (hypertension)   . RAD (reactive airway disease)     Past Surgical History:  Procedure Laterality Date  . AUGMENTATION MAMMAPLASTY    . BREAST BIOPSY     x3  . LAPAROSCOPIC HYSTERECTOMY    . SHOULDER ARTHROSCOPY Bilateral 11/20 40981    Current Medications: Current Meds  Medication Sig  . augmented betamethasone dipropionate (DIPROLENE-AF) 0.05 % cream Apply 1 application topically 2 (two) times daily as needed.  . Cholecalciferol (VITAMIN D) 2000 units tablet Take 2,000 Units by mouth daily.  Marland Kitchen estradiol (ESTRACE) 0.1 MG/GM vaginal cream 1 gram vaginally twice weekly  . estradiol (VIVELLE-DOT) 0.05 MG/24HR patch Place 1 patch (0.05 mg total) onto the skin 2 (two) times a week.  .  fluticasone (FLONASE) 50 MCG/ACT nasal spray Place 2 sprays into both nostrils daily.  . Fluticasone-Salmeterol (ADVAIR) 250-50 MCG/DOSE AEPB Inhale 1 puff into the lungs 2 (two) times daily.  Marland Kitchen ipratropium (ATROVENT) 0.03 % nasal spray ipratropium bromide 0.03 % nasal spray  . nebivolol (BYSTOLIC) 5 MG tablet Take 5 mg by mouth daily.  Marland Kitchen spironolactone (ALDACTONE) 100 MG tablet Take 100 mg by mouth daily.     Allergies:   Blue dyes (parenteral) and Sulfa antibiotics   Social History   Socioeconomic History  . Marital status: Married    Spouse name: Not on file  . Number of children: Not on file  . Years of education: Not on file  . Highest education level: Not on file  Occupational History  . Not on file  Tobacco Use  . Smoking status: Never Smoker  . Smokeless tobacco: Never Used  Vaping Use  . Vaping Use: Never used  Substance and Sexual Activity  . Alcohol use: Yes    Alcohol/week: 14.0 standard drinks    Types: 14 Standard drinks or equivalent per week  . Drug use: No  . Sexual activity: Yes    Partners: Male    Birth control/protection: Surgical  Other Topics Concern  . Not on file  Social History Narrative  . Not on file   Social Determinants of Health   Financial Resource Strain:   . Difficulty of Paying Living Expenses: Not on file  Food Insecurity:   . Worried About Charity fundraiser in  the Last Year: Not on file  . Ran Out of Food in the Last Year: Not on file  Transportation Needs:   . Lack of Transportation (Medical): Not on file  . Lack of Transportation (Non-Medical): Not on file  Physical Activity:   . Days of Exercise per Week: Not on file  . Minutes of Exercise per Session: Not on file  Stress:   . Feeling of Stress : Not on file  Social Connections:   . Frequency of Communication with Friends and Family: Not on file  . Frequency of Social Gatherings with Friends and Family: Not on file  . Attends Religious Services: Not on file  . Active  Member of Clubs or Organizations: Not on file  . Attends Archivist Meetings: Not on file  . Marital Status: Not on file     Family History: The patient's family history includes Cancer in her maternal grandmother; Heart disease in her father; Hypertension in her sister; Thyroid disease in her mother.  ROS:   Please see the history of present illness.    All other systems reviewed and are negative.  EKGs/Labs/Other Studies Reviewed:    The following studies were reviewed today:  EKG:  EKG is ordered today.  The ekg ordered today demonstrates sinus rhythm, normal EKG, no prior available for comparison.  This was personally reviewed.  Recent Labs: 11/14/2019: Hemoglobin 13.9; Platelets 338.0  Recent Lipid Panel    Component Value Date/Time   CHOL 200 11/02/2015 1604   TRIG 88 11/02/2015 1604   HDL 63 11/02/2015 1604   CHOLHDL 3.2 11/02/2015 1604   VLDL 18 11/02/2015 1604   LDLCALC 119 11/02/2015 1604    Physical Exam:    VS:  BP 124/80   Pulse 72   Ht 5' 6"  (1.676 m)   Wt 155 lb 6.4 oz (70.5 kg)   SpO2 99%   BMI 25.08 kg/m     Wt Readings from Last 3 Encounters:  04/14/20 155 lb 6.4 oz (70.5 kg)  03/17/20 156 lb 6.4 oz (70.9 kg)  03/02/20 158 lb 3.2 oz (71.8 kg)    GEN:  Well nourished, younger appearing well developed in no acute distress HEENT: Normal NECK: No JVD; No carotid bruits LYMPHATICS: No lymphadenopathy CARDIAC: RRR, no murmurs, rubs, gallops RESPIRATORY:  Clear to auscultation without rales, wheezing or rhonchi  ABDOMEN: Soft, non-tender, non-distended MUSCULOSKELETAL:  No edema; No deformity  SKIN: Warm and dry NEUROLOGIC:  Alert and oriented x 3 PSYCHIATRIC:  Normal affect    ASSESSMENT:    1. Coronary artery calcification seen on CT scan   2. Hyperlipidemia, unspecified hyperlipidemia type   3. Essential hypertension    PLAN:    In order of problems listed above:  1. Coronary artery calcification on CT, we will obtain calcium  scoring to further quantify and assign percentile for age and sex, given her significant functional capacity and physical activity and lack of symptoms I do not think that we need to perform coronary CTA or stress testing.  However, with LDL of 148 and evidence of calcium scoring on coronary arteries we will start aspirin and low-dose rosuvastatin.  Preventive strategy has been proved to improve outcomes and prevent MACE.  We will check NMR lipids and LFTs in 4 weeks. 2. Hyperlipidemia -as above. 3. Hypertension -well-controlled on nebivolol and spironolactone.  Medication Adjustments/Labs and Tests Ordered: Current medicines are reviewed at length with the patient today.  Concerns regarding medicines are outlined above.  Orders Placed This Encounter  Procedures  . CT CARDIAC SCORING  . Apolipoprotein B  . Lipoprotein A (LPA)  . NMR, lipoprofile  . Comp Met (CMET)  . EKG 12-Lead   Meds ordered this encounter  Medications  . rosuvastatin (CRESTOR) 5 MG tablet    Sig: Take 1 tablet (5 mg total) by mouth daily.    Dispense:  90 tablet    Refill:  3    Patient Instructions  Medication Instructions:   START TAKING ROSUVASTATIN 5 MG BY MOUTH DAILY  *If you need a refill on your cardiac medications before your next appointment, please call your pharmacy*  Lab Work:  IN 4 WEEKS, HERE AT THE OFFICE--WE WILL CHECK A CMET, NMR LIPOPROFILE, LIPOPROTEIN A, AND APOLIPOPROTEIN B  If you have labs (blood work) drawn today and your tests are completely normal, you will receive your results only by: Marland Kitchen MyChart Message (if you have MyChart) OR . A paper copy in the mail If you have any lab test that is abnormal or we need to change your treatment, we will call you to review the results.   Testing/Procedures:  CALCIUM SCORE TO BE DONE HERE IN THE OFFICE.   Follow-Up: At [facility], you and your health needs are our priority.  As part of our continuing mission to provide you with  exceptional heart care, we have created designated Provider Care Teams.  These Care Teams include your primary Cardiologist (physician) and Advanced Practice Providers (APPs -  Physician Assistants and Nurse Practitioners) who all work together to provide you with the care you need, when you need it.  We recommend signing up for the patient portal called "MyChart".  Sign up information is provided on this After Visit Summary.  MyChart is used to connect with patients for Virtual Visits (Telemedicine).  Patients are able to view lab/test results, encounter notes, upcoming appointments, etc.  Non-urgent messages can be sent to your provider as well.   To learn more about what you can do with MyChart, go to [email or web address].    Your next appointment:   12 month(s)  The format for your next appointment:   In Person  Provider:   Ena Dawley, MD       Signed, Ena Dawley, MD  04/14/2020 5:36 PM    [place]

## 2020-04-14 NOTE — Patient Instructions (Signed)
Medication Instructions:   START TAKING ROSUVASTATIN 5 MG BY MOUTH DAILY  *If you need a refill on your cardiac medications before your next appointment, please call your pharmacy*  Lab Work:  IN 4 WEEKS, HERE AT THE OFFICE--WE WILL CHECK A CMET, NMR LIPOPROFILE, LIPOPROTEIN A, AND APOLIPOPROTEIN B  If you have labs (blood work) drawn today and your tests are completely normal, you will receive your results only by: Marland Kitchen MyChart Message (if you have MyChart) OR . A paper copy in the mail If you have any lab test that is abnormal or we need to change your treatment, we will call you to review the results.   Testing/Procedures:  CALCIUM SCORE TO BE DONE HERE IN THE OFFICE.   Follow-Up: At [facility], you and your health needs are our priority.  As part of our continuing mission to provide you with exceptional heart care, we have created designated Provider Care Teams.  These Care Teams include your primary Cardiologist (physician) and Advanced Practice Providers (APPs -  Physician Assistants and Nurse Practitioners) who all work together to provide you with the care you need, when you need it.  We recommend signing up for the patient portal called "MyChart".  Sign up information is provided on this After Visit Summary.  MyChart is used to connect with patients for Virtual Visits (Telemedicine).  Patients are able to view lab/test results, encounter notes, upcoming appointments, etc.  Non-urgent messages can be sent to your provider as well.   To learn more about what you can do with MyChart, go to [email or web address].    Your next appointment:   12 month(s)  The format for your next appointment:   In Person  Provider:   Tobias Alexander, MD

## 2020-04-15 ENCOUNTER — Other Ambulatory Visit: Payer: Self-pay | Admitting: Obstetrics and Gynecology

## 2020-04-15 DIAGNOSIS — R928 Other abnormal and inconclusive findings on diagnostic imaging of breast: Secondary | ICD-10-CM

## 2020-04-24 ENCOUNTER — Ambulatory Visit
Admission: RE | Admit: 2020-04-24 | Discharge: 2020-04-24 | Disposition: A | Discharge status: Home / Self Care | Payer: BC Managed Care – PPO | Source: Ambulatory Visit | Attending: Obstetrics and Gynecology | Admitting: Obstetrics and Gynecology

## 2020-04-24 ENCOUNTER — Other Ambulatory Visit: Payer: Self-pay | Admitting: Obstetrics and Gynecology

## 2020-04-24 ENCOUNTER — Other Ambulatory Visit: Payer: Self-pay

## 2020-04-24 DIAGNOSIS — R928 Other abnormal and inconclusive findings on diagnostic imaging of breast: Secondary | ICD-10-CM

## 2020-04-24 DIAGNOSIS — N6012 Diffuse cystic mastopathy of left breast: Secondary | ICD-10-CM | POA: Diagnosis not present

## 2020-04-24 DIAGNOSIS — R922 Inconclusive mammogram: Secondary | ICD-10-CM | POA: Diagnosis not present

## 2020-04-24 DIAGNOSIS — N6011 Diffuse cystic mastopathy of right breast: Secondary | ICD-10-CM | POA: Diagnosis not present

## 2020-04-28 ENCOUNTER — Other Ambulatory Visit: Payer: BC Managed Care – PPO

## 2020-05-11 ENCOUNTER — Other Ambulatory Visit: Payer: BC Managed Care – PPO | Admitting: *Deleted

## 2020-05-11 ENCOUNTER — Other Ambulatory Visit: Payer: Self-pay

## 2020-05-11 ENCOUNTER — Ambulatory Visit (INDEPENDENT_AMBULATORY_CARE_PROVIDER_SITE_OTHER)
Admission: RE | Admit: 2020-05-11 | Discharge: 2020-05-11 | Disposition: A | Discharge status: Home / Self Care | Payer: Self-pay | Source: Ambulatory Visit | Attending: Cardiology | Admitting: Cardiology

## 2020-05-11 DIAGNOSIS — E785 Hyperlipidemia, unspecified: Secondary | ICD-10-CM

## 2020-05-11 DIAGNOSIS — I10 Essential (primary) hypertension: Secondary | ICD-10-CM

## 2020-05-11 DIAGNOSIS — I2510 Atherosclerotic heart disease of native coronary artery without angina pectoris: Secondary | ICD-10-CM

## 2020-05-12 LAB — COMPREHENSIVE METABOLIC PANEL
ALT: 17 IU/L (ref 0–32)
AST: 23 IU/L (ref 0–40)
Albumin/Globulin Ratio: 1.8 (ref 1.2–2.2)
Albumin: 4.9 g/dL — ABNORMAL HIGH (ref 3.8–4.8)
Alkaline Phosphatase: 61 IU/L (ref 44–121)
BUN/Creatinine Ratio: 14 (ref 12–28)
BUN: 14 mg/dL (ref 8–27)
Bilirubin Total: 0.6 mg/dL (ref 0.0–1.2)
CO2: 23 mmol/L (ref 20–29)
Calcium: 9.8 mg/dL (ref 8.7–10.3)
Chloride: 103 mmol/L (ref 96–106)
Creatinine, Ser: 1.03 mg/dL — ABNORMAL HIGH (ref 0.57–1.00)
GFR calc Af Amer: 66 mL/min/{1.73_m2} (ref >59)
GFR calc non Af Amer: 58 mL/min/{1.73_m2} — ABNORMAL LOW (ref >59)
Globulin, Total: 2.7 g/dL (ref 1.5–4.5)
Glucose: 73 mg/dL (ref 65–99)
Potassium: 4.4 mmol/L (ref 3.5–5.2)
Sodium: 139 mmol/L (ref 134–144)
Total Protein: 7.6 g/dL (ref 6.0–8.5)

## 2020-05-12 LAB — NMR, LIPOPROFILE
Cholesterol, Total: 164 mg/dL (ref 100–199)
HDL Particle Number: 41.5 umol/L (ref >=30.5)
HDL-C: 63 mg/dL (ref >39)
LDL Particle Number: 1130 nmol/L — ABNORMAL HIGH (ref <1000)
LDL Size: 20.6 nm (ref >20.5)
LDL-C (NIH Calc): 86 mg/dL (ref 0–99)
LP-IR Score: 35 (ref <=45)
Small LDL Particle Number: 533 nmol/L — ABNORMAL HIGH (ref <=527)
Triglycerides: 82 mg/dL (ref 0–149)

## 2020-05-12 LAB — APOLIPOPROTEIN B: Apolipoprotein B: 72 mg/dL (ref <90)

## 2020-05-12 LAB — LIPOPROTEIN A (LPA): Lipoprotein (a): 14.9 nmol/L (ref <75.0)

## 2020-05-13 ENCOUNTER — Telehealth: Payer: Self-pay | Admitting: *Deleted

## 2020-05-13 DIAGNOSIS — E7800 Pure hypercholesterolemia, unspecified: Secondary | ICD-10-CM

## 2020-05-13 DIAGNOSIS — Z79899 Other long term (current) drug therapy: Secondary | ICD-10-CM

## 2020-05-13 DIAGNOSIS — Z789 Other specified health status: Secondary | ICD-10-CM

## 2020-05-13 DIAGNOSIS — E785 Hyperlipidemia, unspecified: Secondary | ICD-10-CM

## 2020-05-13 DIAGNOSIS — I2510 Atherosclerotic heart disease of native coronary artery without angina pectoris: Secondary | ICD-10-CM

## 2020-05-13 NOTE — Telephone Encounter (Signed)
Her kidney function is normal to borderline, she should increase her hydration, avoid NSAIDs, I would still recommend statin as they are metabolized in the liver and do not affect kidney function.

## 2020-05-13 NOTE — Telephone Encounter (Signed)
Notified the pt with her Calcium score results and labs per Dr. Delton See.  Endorsed to the pt that Dr. Delton See recommends she start low dose rosuvastatin 5 mg po daily and come in for repeat lipids and LFTs in 4-8 weeks. Pt states Dr. Delton See already started her on low dose rosuvastatin 5 mg po daily, at her last OV with the pt.  Pt states she would like for Dr. Delton See advise on alternative regimen beside rosuvastatin or statins, that don't affect her kidney function. Pt was very upset on the phone stating her creatinine was elevated and this wasn't addressed.  Pt states the rosuvastatin is what is causing her creatinine to elevate, and would like another alternative lipid regimen advised on, that won't elevate that number anymore.  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  Pt would like for Dr. Delton See to further advise on this matter. Informed the pt that I will route this message to Dr. Delton See to further advise, and I follow-up with her shortly thereafter, once recommendations are provided.  Pt verbalized understanding and agrees with this plan.

## 2020-05-13 NOTE — Addendum Note (Signed)
Addended by: Loa Socks on: 05/13/2020 11:32 AM   Modules accepted: Orders

## 2020-05-13 NOTE — Telephone Encounter (Signed)
Please refer her to the lipid clinic for consideration of statin alternatives and discussing statin side effects further.

## 2020-05-13 NOTE — Telephone Encounter (Signed)
-----   Message from Lars Masson, MD sent at 05/12/2020  6:07 PM EDT ----- Coronary calcium score of 23. This was 87 percentile for age and sex matched control. Her LDL particle numbers are elevated, other lipids are WNL  Based on an abnormal calcium score I would recommend a low dose rosuvastatin 5 mg po daily, repeat Lipids and LFTs in 4-8 weeks

## 2020-05-13 NOTE — Telephone Encounter (Signed)
refer to lipid clinic per Dr. Delton See Received: Today Donna Billow, LPN [phone] she is schedule

## 2020-05-13 NOTE — Telephone Encounter (Signed)
Spoke back with the pt and informed her that per Dr. Delton See, we will now refer her to our lipid clinic for consideration of statin alternatives, and to discuss statin side effects in more detail. Informed the pt that I will place the referral in the system and send a message to our [facility] schedulers to call her back and arrange this appt. Pt verbalized understanding and agrees with this plan. Pt states she stopped taking rosuvastatin and does not wish to proceed with taking this, until she see's lipid clinic.  Discontinued this from her regimen and updated in her allergies with unspecified reaction, with pt reporting statins increase her creatinine level.  Pt verbalized understanding and agrees with this plan.

## 2020-05-13 NOTE — Telephone Encounter (Signed)
Endorsed recommendations to the pt, per Dr. Delton See. Pt states she has read several medical articles noting that statins do affect renal function.  Pt states she would like to make an appt to see Dr. Delton See to discuss this more in detail, for she does not feel comfortable with taking rosuvastatin and having her creatinine normal to borderline.  Informed the pt that I will speak with Dr. Delton See about this, and follow-up with her shortly thereafter, with further recommendations.  Pt verbalized understanding and agrees with this plan.

## 2020-05-14 DIAGNOSIS — N183 Chronic kidney disease, stage 3 unspecified: Secondary | ICD-10-CM | POA: Diagnosis not present

## 2020-05-14 DIAGNOSIS — Z5181 Encounter for therapeutic drug level monitoring: Secondary | ICD-10-CM | POA: Diagnosis not present

## 2020-05-19 NOTE — Progress Notes (Signed)
Patient ID: RICCI PAFF                 DOB: 16-Feb-1956                    MRN: [id number]     HPI: Donna King is a 64 y.o. female patient referred to lipid clinic by Dr. Meda Coffee. PMH is significant for  hyperlipidemia, hypertension and newly diagnosed coronary calcifications that were incidental finding on noncontrast chest CT, mild emphysema, asthmatic bronchitis.   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 Per latest BMP, the patient was noted to have a Scr of 1.03 (previously 0.86 in 2017) and discontinued rosuvastatin d/t concerns that statin therapy was causing a decline in renal function. Patient is coming to clinic today seeking alternative therapy.    The patient reports that she is doing well today. When asked why she presents today, she states that she was found to have calcium build-up on CT for her interstitial lung disease and is increasingly concerned about her recent advanced lipid panel. She states that she has a long history of renal sensitivity to medications that she feels stems from her history of hypertension. She states that she first saw an increase in scr after she started spironolactone 100 mg daily for it's anti-testosterone effects/HTN, and was found to have a scr increase to 1.34 (baseline circa 0.8). Since this point, the patient has been seen by a nephrologist at [facility] who has monitored her renal function q331mo The patient currently takes spironolactone 25 mg daily & has tolerated this dose well. Per request of the patient, her nephrologist did check a CPK & scr on the patient a few days ago, and her CPK was WNL w/ scr returning to 0.89 since discontinuation of rosuvastatin (>1wk ago). The patient also denies myalgia on rosuvastatin.   Of note, the patient's blood pressure is managed by her nephrologist who targets a SBP goal of 130-140 (concerns for hypo profusion). The  patient self adjusts her medications (valsartan, bystolic, spironolactone) to ensure she stays in this range. She states that she has not taken any valsartan in months, and that her current dose of 1.4.17EYf bystolic and 2581KGf spironolactone has been stable for many months as well. Denies an changes in diet, medications, herbal/dietary supplements, or exercise that may have contributed to her scr increase.   Current Medications: None Intolerances: Rosuvastatin 21m30mRisk Factors: HTN, HLD, calcium score, FH LDL goal: <70 (ASCVD risk 5.3%) w/ established ASCVD d/t CAD on CT  Exercise:  - competitive dancer  Family History:  - father MI & CABG in 80s43se patient's family history includes Cancer in her maternal grandmother; Heart disease in her father; Hypertension in her sister; Thyroid disease in her mother.  Social History:  - neg smoking - etoh: 14/week  Labs: Lipid Panel     Component Value Date/Time   CHOL 200 11/02/2015 1604   TRIG 88 11/02/2015 1604   HDL 63 11/02/2015 1604   CHOLHDL 3.2 11/02/2015 1604   VLDL 18 11/02/2015 1604   LDLCALC 119 11/02/2015 1604    - Lipids 05/11/20: ApoB 72 (WNL, <90), Tchol 164, HDL 63, LDL 86, LDL particle # 1,130 & size 20.6, Lipo(a) 14.9 (<14.9), Tg 82 - Calcium score: 23 (68th percentile)  BMP Latest Ref Rng & Units 05/11/2020 11/02/2015  Glucose 65 - 99 mg/dL  73 94  BUN 8 - 27 mg/dL 14 13  Creatinine 0.57 - 1.00 mg/dL 1.03(H) 0.86  BUN/Creat Ratio 12 - 28 14 -  Sodium 134 - 144 mmol/L 139 140  Potassium 3.5 - 5.2 mmol/L 4.4 4.4  Chloride 96 - 106 mmol/L 103 104  CO2 20 - 29 mmol/L 23 24  Calcium 8.7 - 10.3 mg/dL 9.8 9.6   Hepatic Function Latest Ref Rng & Units 05/11/2020 11/02/2015  Total Protein 6.0 - 8.5 g/dL 7.6 7.1  Albumin 3.8 - 4.8 g/dL 4.9(H) 4.5  AST 0 - 40 IU/L 23 18  ALT 0 - 32 IU/L 17 12  Alk Phosphatase 44 - 121 IU/L 61 43  Total Bilirubin 0.0 - 1.2 mg/dL 0.6 0.7    Past Medical History:  Diagnosis Date  .  Asthma   . Essential (primary) hypertension   . HTN (hypertension)   . RAD (reactive airway disease)     Current Outpatient Medications on File Prior to Visit  Medication Sig Dispense Refill  . augmented betamethasone dipropionate (DIPROLENE-AF) 0.05 % cream Apply 1 application topically 2 (two) times daily as needed.    . Cholecalciferol (VITAMIN D) 2000 units tablet Take 2,000 Units by mouth daily.    Marland Kitchen estradiol (ESTRACE) 0.1 MG/GM vaginal cream 1 gram vaginally twice weekly 42.5 g 1  . estradiol (VIVELLE-DOT) 0.05 MG/24HR patch Place 1 patch (0.05 mg total) onto the skin 2 (two) times a week. 24 patch 3  . fluticasone (FLONASE) 50 MCG/ACT nasal spray Place 2 sprays into both nostrils daily. 16 g 2  . Fluticasone-Salmeterol (ADVAIR) 250-50 MCG/DOSE AEPB Inhale 1 puff into the lungs 2 (two) times daily.    Marland Kitchen ipratropium (ATROVENT) 0.03 % nasal spray ipratropium bromide 0.03 % nasal spray    . nebivolol (BYSTOLIC) 5 MG tablet Take 5 mg by mouth daily.    Marland Kitchen spironolactone (ALDACTONE) 100 MG tablet Take 100 mg by mouth daily.     No current facility-administered medications on file prior to visit.    Allergies  Allergen Reactions  . Blue Dyes (Parenteral) Rash and Other (See Comments)    Pt states she cannot be prescribed cardizem 360 mg because it comes with blue dye on this, and she is allergic to blue dye  . Sulfa Antibiotics Other (See Comments)    Neuropathy  . Rosuvastatin Other (See Comments)    Pt reports its causing her creatinine to elevate.    Assessment/Plan:  1. Hyperlipidemia -  Given the patient's plaque buildup per CT w/ elevated calcium score, the patient would likely benefit from an LDL goal of <70. As such, the patient's LDL is not to goal today. The patient would benefit from high-intensity LLT initiation to decrease her LDL, and target an LDL particle number:LDL ratio of 10:1.   The patient had many questions regarding side effect profile of various LLT therapy  options. Regarding statin therapy, acknowledged that though an increase in scr is not listed in the package insert for Crestor, various large trials such as the [facility] study or [place] study did find an absolute increase in the percentage of patient taking rosuvastatin vs placebo who had an increase in scr, though neither trial found this to be statistically significant. I also agreed with the patient that, given her scr increase upon starting rosuvastatin with subsequent decrease after discontinuing, a retrial with rosuvastatin would not be preferable. Per the [facility] I [facility] II trial, suggested to the patient that atorvastatin may be a  safer option. Further, would prefer atorvastatin in this patient vs an alternative statin considering atorvastatin is not renally adjusted and theoretically less likely to cause renal damage. Out of caution, suggested to the patient that she initiate atorvastatin 10 mg daily with repeat BMET in 2-4 weeks to assess tolerance. The patient voiced concerns regarding subtherapeutic dosing, wanting a high-intensity statin that would get her LDL to goal. As such, instructed the patient to initiate 40 mg of atorvastatin daily with repeat BMET in 4wks to assess renal tolerance. Gave patient orders to get BMP and NMR at [facility]. Plans to get done the week of Thanksgiving.  The patient also requested information regarding PCSK-9i and Bempedoic acid. I reviewed the package inserts of these medications and phase III clinical trials conducted for approval with the patient for evidence of renal effects (none were discovered). Discussed benefits of these agents (expected LDL lowering, CV mortality data, dosing and cost). Also discussed with patient that she will likely need to fail a trial of two different statins prior to her insurance covering a PCSK9-I. Patient agreed that she will research these agents more and consider if she does not tolerate atorvastatin per BMET in 4wks.   50 minutes spent  with patient  Thank you,  Otho Darner, Pharmacy Student Class of [address] [place], [place].D, BCPS, CPP [facility]  [address], [place], [place] [address]  Phone: [phone]; Fax: [phone]

## 2020-05-20 ENCOUNTER — Other Ambulatory Visit: Payer: Self-pay

## 2020-05-20 ENCOUNTER — Ambulatory Visit (INDEPENDENT_AMBULATORY_CARE_PROVIDER_SITE_OTHER): Payer: BC Managed Care – PPO | Admitting: Pharmacist

## 2020-05-20 DIAGNOSIS — E785 Hyperlipidemia, unspecified: Secondary | ICD-10-CM | POA: Diagnosis not present

## 2020-05-20 MED ORDER — ATORVASTATIN CALCIUM 40 MG PO TABS: 40.0000 mg | ORAL_TABLET | Freq: Every day | ORAL | 3 refills | Status: DC

## 2020-05-20 NOTE — Patient Instructions (Addendum)
Thank you for seeing [place] today!  Given your recent calcium score and calcium buildup on CT, we agree that you should be on antihyperlipid therapy to decrease your LDL.   As such, we suggest you START atorvastatin 40 mg once daily. We will labs from you in 4 weeks to ensure that your kidneys are tolerating this medication.    Today we also talked about the following:  - Start one of the following medications that are PCSK9i   - Repatha  - Praluent  - both of these medications are dosed once every 2 weeks - these medications are associated with a 60% in your LDL - it is likely that your insurance will not cover these until you have tried two different statin therapies, so if your scr increases on atorvastatin, we can appeal your insurance for these medications.   The other medication that we talked about is the combination of ezetimibe/bempedoic acid. This is an oral medication given daily. This medication does not have the established long-term mortality benefit that we see with PCSK9-I and statins. This medication is associated with a maximum of 40% decrease in LDL.   If you have any questions, please feel free to call us directly at [phone].  Thank you, Abby

## 2020-05-22 DIAGNOSIS — Z20822 Contact with and (suspected) exposure to covid-19: Secondary | ICD-10-CM | POA: Diagnosis not present

## 2020-06-01 NOTE — Telephone Encounter (Signed)
mychart message sent by pt:  To: [facility]    From: Donna King    Created: 05/29/2020 4:07 PM     *-*-*This message was handled on 05/29/2020 4:29 PM by Christen Butter*-*-*  I am finding my best response regarding my breathing is to asthmanex HFA 200 rather than with the advair.  Could you please send me in a 3 month  rx for it to my [facility] on [address]?  (The advair seems to give me a hoarse voice).   Thanks.        Pt said that she had samples of the Asmanex that she has been using.  Dr. Isaiah Serge, please advise if you are okay with  [place] sending Rx of Asmanex to pharmacy for pt.

## 2020-06-02 MED ORDER — ASMANEX HFA 200 MCG/ACT IN AERO: 2.0000 | INHALATION_SPRAY | Freq: Two times a day (BID) | RESPIRATORY_TRACT | 3 refills | Status: DC

## 2020-06-02 NOTE — Telephone Encounter (Signed)
Yes. Please renew asmanex

## 2020-06-09 DIAGNOSIS — M25532 Pain in left wrist: Secondary | ICD-10-CM | POA: Diagnosis not present

## 2020-06-09 DIAGNOSIS — M25531 Pain in right wrist: Secondary | ICD-10-CM | POA: Diagnosis not present

## 2020-06-15 ENCOUNTER — Other Ambulatory Visit: Payer: BC Managed Care – PPO

## 2020-06-16 DIAGNOSIS — E785 Hyperlipidemia, unspecified: Secondary | ICD-10-CM | POA: Diagnosis not present

## 2020-06-17 ENCOUNTER — Telehealth: Payer: Self-pay | Admitting: *Deleted

## 2020-06-17 DIAGNOSIS — Z79899 Other long term (current) drug therapy: Secondary | ICD-10-CM

## 2020-06-17 DIAGNOSIS — E785 Hyperlipidemia, unspecified: Secondary | ICD-10-CM

## 2020-06-17 DIAGNOSIS — Z0189 Encounter for other specified special examinations: Secondary | ICD-10-CM

## 2020-06-17 LAB — NMR, LIPOPROFILE
Cholesterol, Total: 150 mg/dL (ref 100–199)
HDL Particle Number: 42.4 umol/L (ref >=30.5)
HDL-C: 58 mg/dL (ref >39)
LDL Particle Number: 903 nmol/L (ref <1000)
LDL Size: 20.3 nm — ABNORMAL LOW (ref >20.5)
LDL-C (NIH Calc): 74 mg/dL (ref 0–99)
LP-IR Score: 41 (ref <=45)
Small LDL Particle Number: 454 nmol/L (ref <=527)
Triglycerides: 98 mg/dL (ref 0–149)

## 2020-06-17 LAB — BASIC METABOLIC PANEL
BUN/Creatinine Ratio: 18 (ref 12–28)
BUN: 17 mg/dL (ref 8–27)
CO2: 23 mmol/L (ref 20–29)
Calcium: 9.6 mg/dL (ref 8.7–10.3)
Chloride: 101 mmol/L (ref 96–106)
Creatinine, Ser: 0.97 mg/dL (ref 0.57–1.00)
GFR calc Af Amer: 71 mL/min/{1.73_m2} (ref >59)
GFR calc non Af Amer: 62 mL/min/{1.73_m2} (ref >59)
Glucose: 92 mg/dL (ref 65–99)
Potassium: 4.2 mmol/L (ref 3.5–5.2)
Sodium: 135 mmol/L (ref 134–144)

## 2020-06-17 NOTE — Addendum Note (Signed)
Addended by: Loa Socks on: 06/17/2020 02:42 PM   Modules accepted: Orders

## 2020-06-17 NOTE — Telephone Encounter (Signed)
-----   Message from Lars Masson, MD sent at 06/17/2020  1:31 PM EST ----- Significant improvement in LDL particles numbers, now at goal, normal LFTs. Continue the same management.

## 2020-06-17 NOTE — Telephone Encounter (Signed)
Pt made aware of lab results and to continue her current regimen per Dr. Delton See.  Pt request to have a BMET drawn again in one month, to follow-up and make sure her creatinine stays within-normal-limits.  Pt associates previous statin use in making her "creatinine" elevated in the past, despite her LFTs being normal and statins being metabolized in the liver and not by the kidneys.  Pt request to have a follow-up BMET order placed for her to have done at a [facility] in one month, for monitoring of her kidney function.  BMET order placed for one month out to be done at any [facility] of her choice.  Pt verbalized understanding and agrees with this plan. Will send to Dr. Delton See as a general FYI.

## 2020-06-24 NOTE — Telephone Encounter (Signed)
Dr Isaiah Serge, please advise on pt email:  Unfortunately, Asmanex is in such short supply that none of the many pharmacies I asked have any.   They say it is a Community education officer.  So I checked with several pharmacies, and with my insurance, and the alternative seems to be Qvar 80 mcg inhaler.  Could you please send a 3 month supply rx to the [facility] on [address]?  That is a new pharmacy for me, so it will not be the one listed on my chart currently.  Thank you!!

## 2020-06-25 MED ORDER — BECLOMETHASONE DIPROPIONATE 80 MCG/ACT IN AERS: 2.0000 | INHALATION_SPRAY | Freq: Two times a day (BID) | RESPIRATORY_TRACT | 3 refills | Status: DC

## 2020-06-25 NOTE — Telephone Encounter (Signed)
Ok to send prescription for qvar instead of asmanex

## 2020-07-09 DIAGNOSIS — M25532 Pain in left wrist: Secondary | ICD-10-CM | POA: Diagnosis not present

## 2020-07-09 DIAGNOSIS — M25531 Pain in right wrist: Secondary | ICD-10-CM | POA: Diagnosis not present

## 2020-07-20 ENCOUNTER — Encounter: Payer: Self-pay | Admitting: Obstetrics and Gynecology

## 2020-07-20 ENCOUNTER — Other Ambulatory Visit: Payer: Self-pay

## 2020-07-20 ENCOUNTER — Ambulatory Visit (INDEPENDENT_AMBULATORY_CARE_PROVIDER_SITE_OTHER): Payer: BC Managed Care – PPO | Admitting: Obstetrics and Gynecology

## 2020-07-20 VITALS — BP 128/72 | HR 68 | Ht 66.0 in | Wt 158.0 lb

## 2020-07-20 DIAGNOSIS — N760 Acute vaginitis: Secondary | ICD-10-CM

## 2020-07-20 MED ORDER — TERCONAZOLE 0.8 % VA CREA: 1.0000 | TOPICAL_CREAM | Freq: Every day | VAGINAL | 0 refills | Status: DC

## 2020-07-20 MED ORDER — FLUCONAZOLE 150 MG PO TABS: ORAL_TABLET | ORAL | 0 refills | Status: DC

## 2020-07-20 NOTE — Progress Notes (Signed)
GYNECOLOGY  VISIT   HPI: 64 y.o.   Married White or Caucasian Not Hispanic or Latino  female   [phone] with No LMP recorded. Patient has had a hysterectomy.   here for Vaginal infection. Patient states that it started about 4 days ago.  She thinks she has a yeast infection. She used monistat 3 and boric acid. Feeling minimally better. Itching and burning, yellow d/c. No odor.   GYNECOLOGIC HISTORY: No LMP recorded. Patient has had a hysterectomy. Contraception:None  Menopausal hormone therapy: Estradiol patch.         OB History    Gravida  3   Para  2   Term  2   Preterm      AB  1   Living  2     SAB  1   IAB      Ectopic      Multiple      Live Births  2              Patient Active Problem List   Diagnosis Date Noted  . Hyperlipidemia 05/20/2020  . Lichen planus 03/02/2020  . Mild intermittent asthma, uncomplicated 03/02/2020  . Encounter for counseling 12/17/2019  . Arthritis of left acromioclavicular joint 09/07/2019  . Superior glenoid labrum lesion of left shoulder 09/07/2019  . Type 2 superior labrum extending from anterior to posterior (SLAP) lesion of right shoulder 09/10/2018  . Reactive airways dysfunction syndrome (HCC) 08/03/2017  . Chronic obstructive lung disease (HCC) 06/09/2017  . Dyspnea 02/02/2016  . Inappropriate sinus tachycardia 10/14/2014  . HTN (hypertension) 10/14/2014  . Palpitations 09/05/2014    Past Medical History:  Diagnosis Date  . Asthma   . Essential (primary) hypertension   . HTN (hypertension)   . RAD (reactive airway disease)     Past Surgical History:  Procedure Laterality Date  . AUGMENTATION MAMMAPLASTY    . BREAST BIOPSY     x3  . LAPAROSCOPIC HYSTERECTOMY    . SHOULDER ARTHROSCOPY Bilateral 11/20 27782    Current Outpatient Medications  Medication Sig Dispense Refill  . atorvastatin (LIPITOR) 40 MG tablet Take 1 tablet (40 mg total) by mouth daily. 90 tablet 3  . beclomethasone (QVAR) 80 MCG/ACT  inhaler Inhale 2 puffs into the lungs 2 (two) times daily. 3 each 3  . Cholecalciferol (VITAMIN D) 2000 units tablet Take 2,000 Units by mouth daily.    Marland Kitchen estradiol (VIVELLE-DOT) 0.05 MG/24HR patch Place 1 patch (0.05 mg total) onto the skin 2 (two) times a week. 24 patch 3  . fluticasone (FLONASE) 50 MCG/ACT nasal spray Place 2 sprays into both nostrils daily. 16 g 2  . ipratropium (ATROVENT) 0.03 % nasal spray ipratropium bromide 0.03 % nasal spray    . nebivolol (BYSTOLIC) 5 MG tablet Take 5 mg by mouth daily.    Marland Kitchen spironolactone (ALDACTONE) 100 MG tablet Take 100 mg by mouth daily.     No current facility-administered medications for this visit.     ALLERGIES: Sulfa antibiotics and Rosuvastatin  Family History  Problem Relation Age of Onset  . Thyroid disease Mother   . Heart disease Father   . Hypertension Sister   . Cancer Maternal Grandmother        pancrease    Social History   Socioeconomic History  . Marital status: Married    Spouse name: Not on file  . Number of children: Not on file  . Years of education: Not on file  . Highest  education level: Not on file  Occupational History  . Not on file  Tobacco Use  . Smoking status: Never Smoker  . Smokeless tobacco: Never Used  Vaping Use  . Vaping Use: Never used  Substance and Sexual Activity  . Alcohol use: Yes    Alcohol/week: 14.0 standard drinks    Types: 14 Standard drinks or equivalent per week  . Drug use: No  . Sexual activity: Yes    Partners: Male    Birth control/protection: Surgical  Other Topics Concern  . Not on file  Social History Narrative  . Not on file   Social Determinants of Health   Financial Resource Strain: Not on file  Food Insecurity: Not on file  Transportation Needs: Not on file  Physical Activity: Not on file  Stress: Not on file  Social Connections: Not on file  Intimate Partner Violence: Not on file    Review of Systems  All other systems reviewed and are  negative.   PHYSICAL EXAMINATION:    BP 128/72   Pulse 68   Ht 5\' 6"  (1.676 m)   Wt 158 lb (71.7 kg)   SpO2 99%   BMI 25.50 kg/m     General appearance: alert, cooperative and appears stated age  Pelvic: External genitalia:  no lesions              Urethra:  normal appearing urethra with no masses, tenderness or lesions              Bartholins and Skenes: normal                 Vagina: normal appearing vagina with normal color and discharge, no lesions              Cervix :absent  Chaperone was present for exam.  Wet prep: ? Clue vs artifact, no trich, few wbc KOH: no yeast PH: 5   ASSESSMENT Vulvovaginitis, may have partially treated yeast. She reports h/o frequent no albicans yeast infections.     PLAN Send nuswab for BV and yeast Treat with Terazol 3 She requests weekly diflucan for a few weeks to prevent recurrence She has steroid ointment to use as needed.

## 2020-07-21 ENCOUNTER — Other Ambulatory Visit: Payer: Self-pay | Admitting: Pharmacist

## 2020-07-21 DIAGNOSIS — Z79899 Other long term (current) drug therapy: Secondary | ICD-10-CM | POA: Diagnosis not present

## 2020-07-21 DIAGNOSIS — Z0189 Encounter for other specified special examinations: Secondary | ICD-10-CM

## 2020-07-22 ENCOUNTER — Telehealth: Payer: Self-pay | Admitting: *Deleted

## 2020-07-22 ENCOUNTER — Encounter: Payer: Self-pay | Admitting: Obstetrics and Gynecology

## 2020-07-22 DIAGNOSIS — I10 Essential (primary) hypertension: Secondary | ICD-10-CM

## 2020-07-22 LAB — BASIC METABOLIC PANEL
BUN/Creatinine Ratio: 17 (ref 12–28)
BUN: 18 mg/dL (ref 8–27)
CO2: 25 mmol/L (ref 20–29)
Calcium: 10.3 mg/dL (ref 8.7–10.3)
Chloride: 100 mmol/L (ref 96–106)
Creatinine, Ser: 1.04 mg/dL — ABNORMAL HIGH (ref 0.57–1.00)
GFR calc Af Amer: 66 mL/min/{1.73_m2} (ref >59)
GFR calc non Af Amer: 57 mL/min/{1.73_m2} — ABNORMAL LOW (ref >59)
Glucose: 123 mg/dL — ABNORMAL HIGH (ref 65–99)
Potassium: 4.7 mmol/L (ref 3.5–5.2)
Sodium: 139 mmol/L (ref 134–144)

## 2020-07-22 NOTE — Telephone Encounter (Signed)
-----   Message from Lars Masson, MD sent at 07/22/2020  8:23 AM EST ----- Borderline Crea, she can consider to cut spironolactone to 50 mg po daily

## 2020-07-22 NOTE — Telephone Encounter (Signed)
Spoke with patient. Advised that the best way to rule out if atorvastatin is causing the increase of scr is to hold for 2 weeks and recheck scr. If scr back to about 0.8 then it is probably from the atorvastatin, if its still 0.9-1.03 range then that is probably her new baseline. Patient in agreement with plan, but wanted to recheck in 1 month instead.  She will hold atorvastatin and recheck BMP at [facility] in 1 month. Orders placed and released. Written orders also emailed to patient.

## 2020-07-22 NOTE — Telephone Encounter (Signed)
Spoke with the Donna King and endorsed to her, her lab results and recommendations per Dr. Delton See.  Donna King states she decreased her spironolactone to 25 mg po daily, on her own, about 2-3 months ago.  Will reflect this dose in her medicine list, for Dr. Delton See went based on her last dose we advised on her spironolactone, of 100 mg po daily. Donna King states she would like input from our Pharmacist here in lipid clinic, for she states she thinks the bump in creatinine is still coming from her statin, atorvastatin.  Donna King states she would like a call back from them, so that they could discuss a possible alternative cholesterol regimen, and repeat labs to follow-up on this, in the near future.  Donna King is established with our lipid clinic.  Informed the Donna King that I will route this message to Dr. Delton See as an Lorain Childes, and to our Pharmacist in Lipid Clinic to follow-up with her on this matter.  Donna King verbalized understanding and agrees with this plan.

## 2020-07-22 NOTE — Addendum Note (Signed)
Addended by: Malena Peer D on: 07/22/2020 11:57 AM   Modules accepted: Orders

## 2020-07-23 NOTE — Telephone Encounter (Signed)
Can atorvastatin cause Crea increase?

## 2020-07-23 NOTE — Telephone Encounter (Addendum)
No, atorvastatin typically does not affect renal function. I confirmed that this was discussed with pt but she still insisted on trialing off of the statin for a month. Do not anticipate her renal function will change much off of her statin, so hopefully she can be convinced to resume it after her lab recheck.

## 2020-07-24 LAB — BACTERIAL VAGINOSIS, NAA

## 2020-07-24 LAB — CANDIDA 6 SPECIES PROFILE, NAA
C PARAPSILOSIS/TROPICALIS: NEGATIVE
Candida albicans, NAA: NEGATIVE
Candida glabrata, NAA: NEGATIVE
Candida krusei, NAA: NEGATIVE
Candida lusitaniae, NAA: NEGATIVE

## 2020-08-10 IMAGING — CT CT CHEST W/O CM
2 of 4 series · 15 of 36 positions shown, 18 images · non-contrast
Comparison: Chest radiograph on 11/14/2019

CLINICAL DATA: Right lung nodule recent chest radiograph.

EXAM:
CT CHEST WITHOUT CONTRAST
TECHNIQUE: Multidetector CT imaging of the chest was performed following the
standard protocol without IV contrast.

[Series 2: thorax · axial · 0.69mm/px · z∈[-322,-60]mm · 12 of 155 slices shown, 15 images]
[im 12/155  mediastinal]
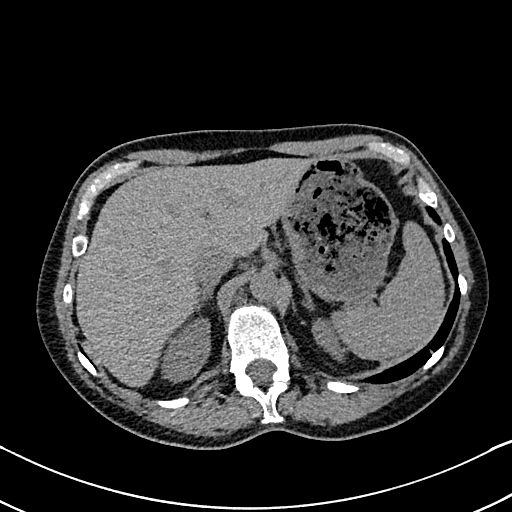
[im 12/155  lung]
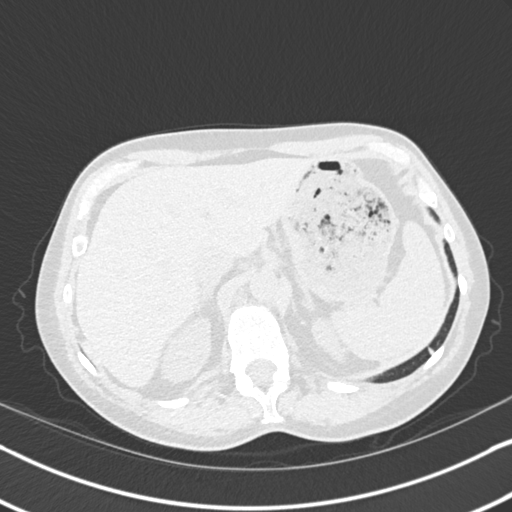
[im 24/155  lung]
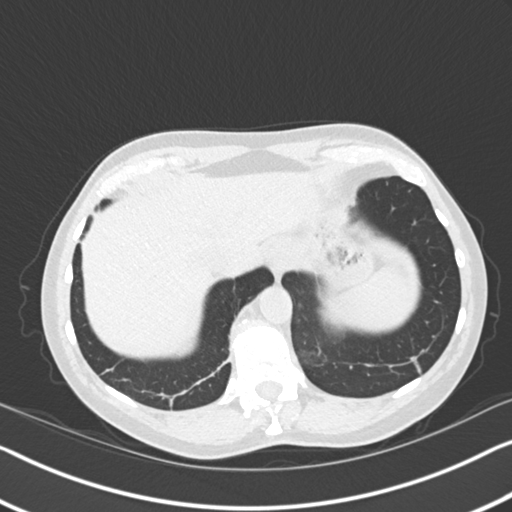
[im 36/155  lung]
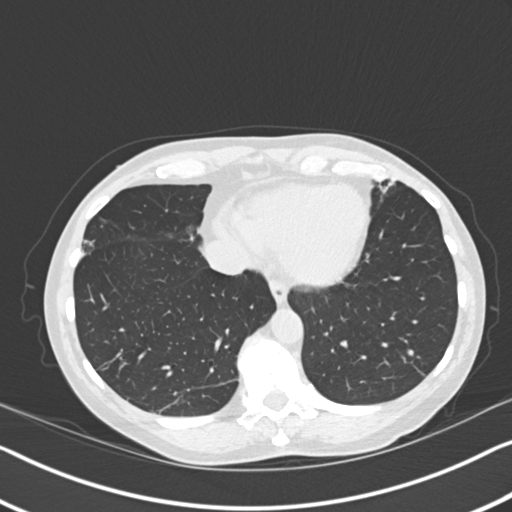
[im 48/155  lung]
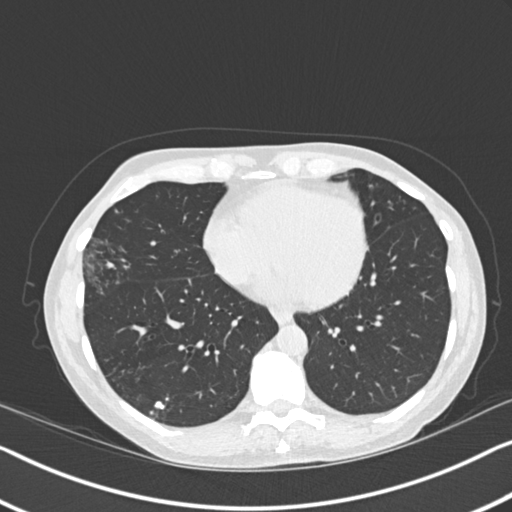
[im 60/155  mediastinal]
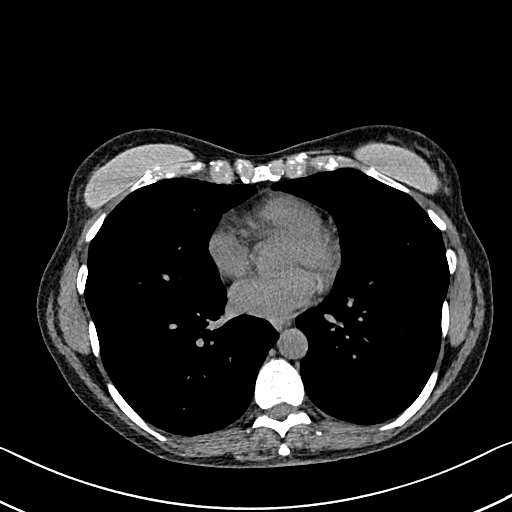
[im 60/155  lung]
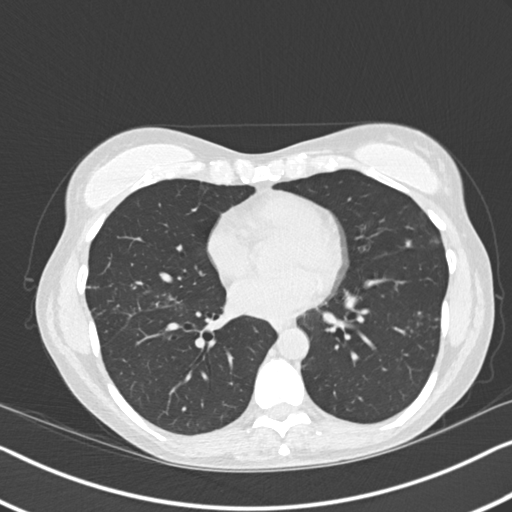
[im 72/155  lung]
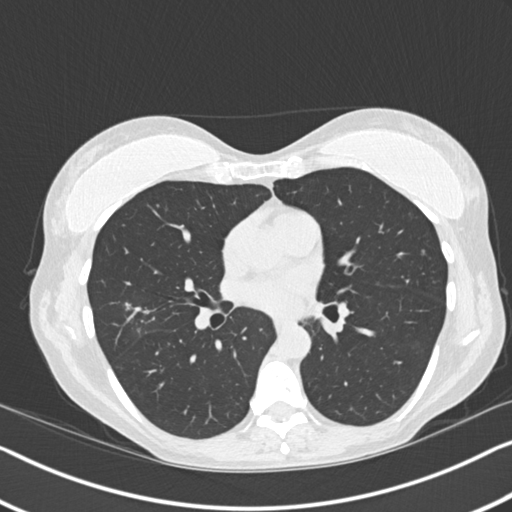
[im 83/155  lung]
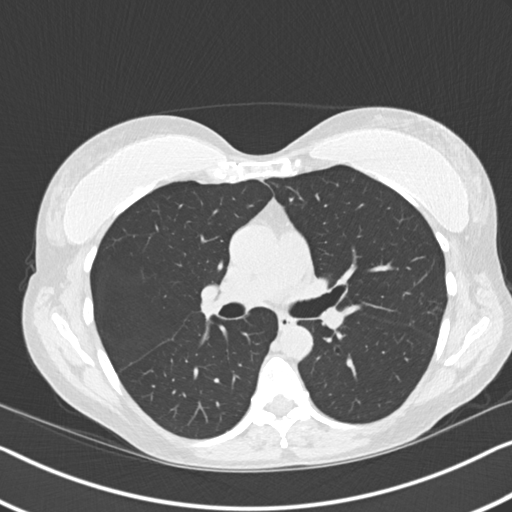
[im 95/155  lung]
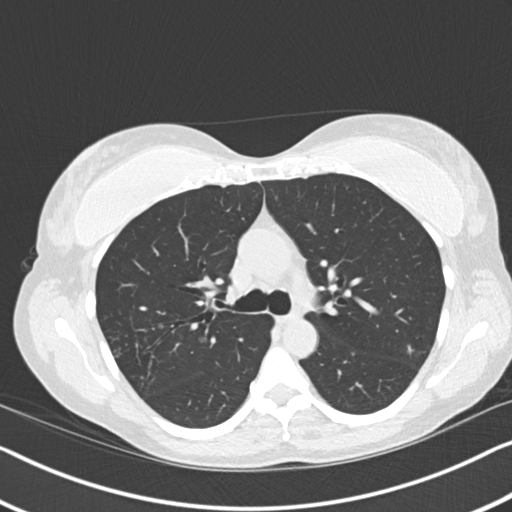
[im 107/155  mediastinal]
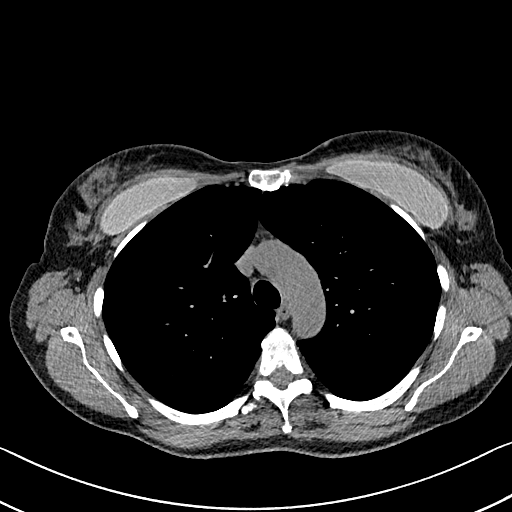
[im 107/155  lung]
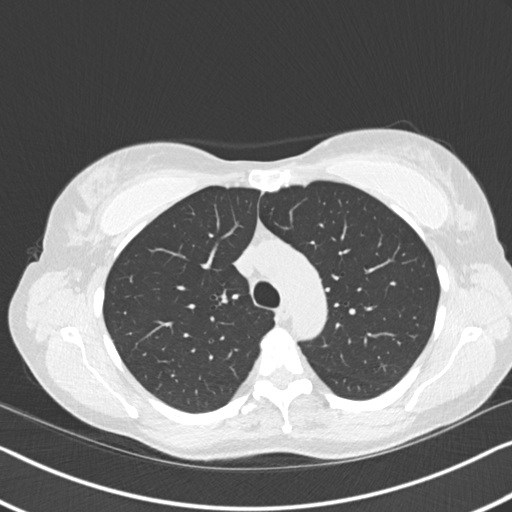
[im 119/155  lung]
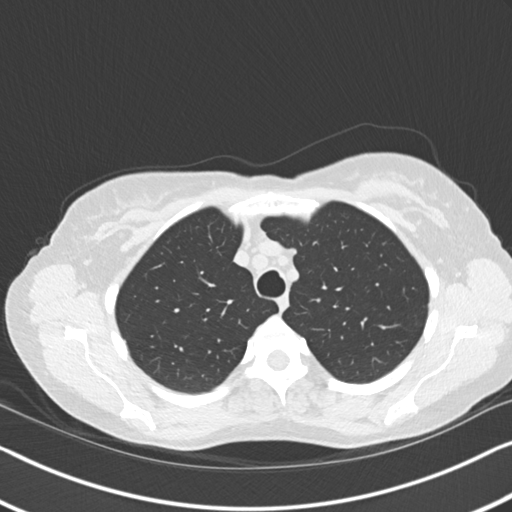
[im 131/155  lung]
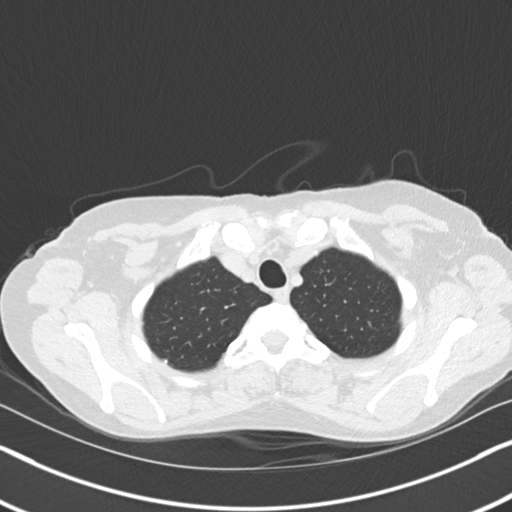
[im 143/155  lung]
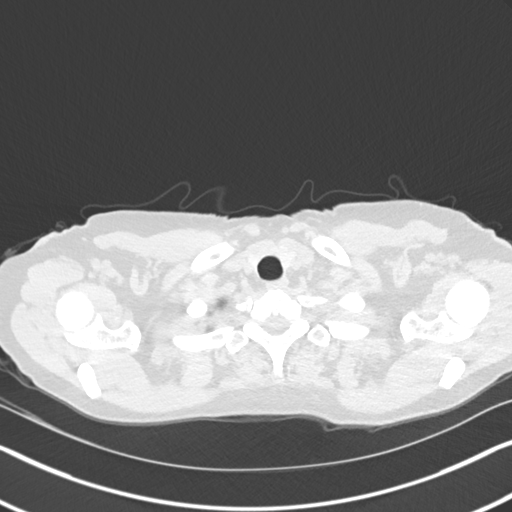

[Series 5: coronal · coronal · 0.61mm/px · 3 of 117 slices shown]
[im 24/117  lung]
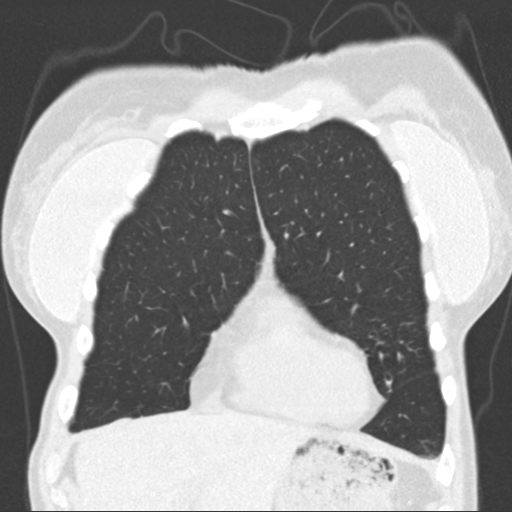
[im 47/117  lung]
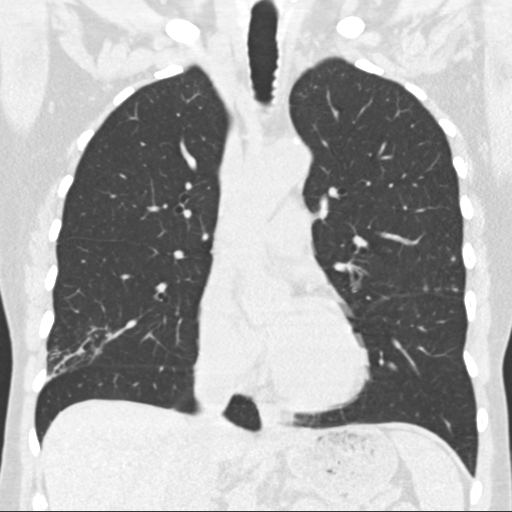
[im 70/117  lung]
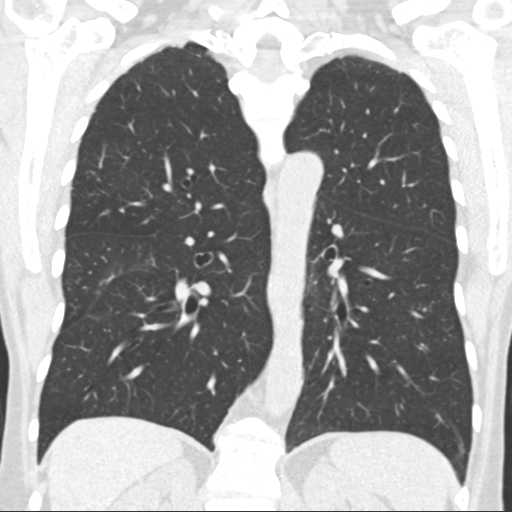

[15 of 36 positions shown; findings below may reference images not displayed]

FINDINGS: Cardiovascular: No acute findings. Aortic and coronary artery
atherosclerosis incidentally noted.

Mediastinum/Nodes: No masses or pathologically enlarged lymph nodes
identified on this unenhanced exam.

Lungs/Pleura: Multifocal areas of reticulonodular opacity are seen
in both lungs which are increased since prior study. Associated
irregular nodular opacities are seen in the posterior right upper
lobe measuring to 1.5 cm on image 89/3, and the inferolateral right
middle lobe measuring 9 mm on image 113/3. Several other scattered
pulmonary nodules are seen bilaterally, largest in the inferior
right lower lobe measuring 8 mm on image 123/3. No evidence of
pleural effusion.

Upper Abdomen:  Unremarkable.

Musculoskeletal: No suspicious bone lesions. Bilateral subpectoral
breast implants noted.
IMPRESSION: 1. Increased multifocal reticulonodular opacities in both lungs.
These findings are suspicious for atypical infectious or
inflammatory etiologies, such as UARIUMUE. Recommend continued follow-up
by chest CT in 3 months.
2. Indeterminate bilateral pulmonary nodules, largest in right upper
lobe measuring 1.5 cm. These are nonspecific and may be inflammatory
or infectious etiology given associated reticulonodular opacities
described above. Neoplasm is considered less likely. Non-contrast
chest CT at 3 months is recommended. If the nodules are stable at
time of repeat CT, then future CT at 18-24 months (from today's
scan) is considered optional for low-risk patients, but is
recommended for high-risk patients. This recommendation follows the
consensus statement: Guidelines for Management of Incidental
Pulmonary Nodules Detected on CT Images: From the [HOSPITAL]
3. No evidence of lymphadenopathy or pleural effusion.

Aortic Atherosclerosis (54HXC-ONL.L).

## 2020-08-20 DIAGNOSIS — I10 Essential (primary) hypertension: Secondary | ICD-10-CM | POA: Diagnosis not present

## 2020-08-21 LAB — BASIC METABOLIC PANEL
BUN/Creatinine Ratio: 16 (ref 12–28)
BUN: 15 mg/dL (ref 8–27)
CO2: 23 mmol/L (ref 20–29)
Calcium: 9.5 mg/dL (ref 8.7–10.3)
Chloride: 102 mmol/L (ref 96–106)
Creatinine, Ser: 0.93 mg/dL (ref 0.57–1.00)
GFR calc Af Amer: 75 mL/min/{1.73_m2} (ref >59)
GFR calc non Af Amer: 65 mL/min/{1.73_m2} (ref >59)
Glucose: 101 mg/dL — ABNORMAL HIGH (ref 65–99)
Potassium: 4.6 mmol/L (ref 3.5–5.2)
Sodium: 139 mmol/L (ref 134–144)

## 2020-09-22 ENCOUNTER — Other Ambulatory Visit (HOSPITAL_COMMUNITY): Payer: Self-pay | Admitting: Gastroenterology

## 2020-09-22 ENCOUNTER — Other Ambulatory Visit: Payer: Self-pay | Admitting: Gastroenterology

## 2020-09-22 DIAGNOSIS — R110 Nausea: Secondary | ICD-10-CM | POA: Diagnosis not present

## 2020-09-22 DIAGNOSIS — R1011 Right upper quadrant pain: Secondary | ICD-10-CM | POA: Diagnosis not present

## 2020-09-22 DIAGNOSIS — Z86010 Personal history of colonic polyps: Secondary | ICD-10-CM | POA: Diagnosis not present

## 2020-09-24 ENCOUNTER — Ambulatory Visit (HOSPITAL_BASED_OUTPATIENT_CLINIC_OR_DEPARTMENT_OTHER)
Admission: RE | Admit: 2020-09-24 | Discharge: 2020-09-24 | Disposition: A | Discharge status: Home / Self Care | Payer: BC Managed Care – PPO | Source: Ambulatory Visit | Attending: Gastroenterology | Admitting: Gastroenterology

## 2020-09-24 ENCOUNTER — Other Ambulatory Visit: Payer: Self-pay

## 2020-09-24 DIAGNOSIS — R1011 Right upper quadrant pain: Secondary | ICD-10-CM | POA: Diagnosis not present

## 2020-09-25 ENCOUNTER — Encounter: Payer: Self-pay | Admitting: Pharmacist

## 2020-09-25 ENCOUNTER — Ambulatory Visit (INDEPENDENT_AMBULATORY_CARE_PROVIDER_SITE_OTHER): Payer: BC Managed Care – PPO | Admitting: Pharmacist

## 2020-09-25 DIAGNOSIS — E785 Hyperlipidemia, unspecified: Secondary | ICD-10-CM | POA: Diagnosis not present

## 2020-09-25 MED ORDER — EVOLOCUMAB 140 MG/ML ~~LOC~~ SOAJ
1.0000 mL | SUBCUTANEOUS | 3 refills | Status: AC
Start: 2020-09-25 — End: 2020-12-18

## 2020-09-25 NOTE — Progress Notes (Signed)
Patient ID: Donna King                 DOB: 1956-01-05                    MRN: [id number]     HPI: [name] is a 65 y.o. female patient referred to lipid clinic by Dr. Delton See. PMH is significant for HTN and obstructive lung disease.  Has tried atorvastatin and rosuvastatin in the past and both caused an increase in Scr and patient preferred not to continue.  Patient presents today in good spirits.  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  Is concerned regarding her renal function and is followed by nephrology.  However is also concerned regarding her lipid profile and wants to reach goal without statins.  Current Medications: n/a Intolerances: rosuvastatin, atorvastatin  Risk Factors: HTN, HLD, calcium score LDL goal: <70 (ASCVD risk 2.2%) w/ established ASCVD d/t CAD on CT  Family History: father MI & CABG in 22s The patient'sfamily history includes Cancer in her maternal grandmother; Heart disease in her father; Hypertension in her sister; Thyroid disease in her mother.  Labs:  LDL particle number [id number], LDL 74, HDL 58, Trigs 98, TC 150, HDL particle number 42.4, small LDL particle number 454, LDL size 20.3  (06/16/20 - on atorvastatin 40mg )  Past Medical History:  Diagnosis Date   Asthma    Essential (primary) hypertension    HTN (hypertension)    RAD (reactive airway disease)     Current Outpatient Medications on File Prior to Visit  Medication Sig Dispense Refill   atorvastatin (LIPITOR) 40 MG tablet Take 1 tablet (40 mg total) by mouth daily. 90 tablet 3   beclomethasone (QVAR) 80 MCG/ACT inhaler Inhale 2 puffs into the lungs 2 (two) times daily. 3 each 3   Cholecalciferol (VITAMIN D) 2000 units tablet Take 2,000 Units by mouth daily.     estradiol (VIVELLE-DOT) 0.05 MG/24HR patch Place 1 patch (0.05 mg total) onto the skin 2 (two) times a week. 24 patch 3   fluconazole (DIFLUCAN) 150 MG tablet Take one tablet weekly for 3 weeks 3 tablet 0    fluticasone (FLONASE) 50 MCG/ACT nasal spray Place 2 sprays into both nostrils daily. 16 g 2   ipratropium (ATROVENT) 0.03 % nasal spray ipratropium bromide 0.03 % nasal spray     nebivolol (BYSTOLIC) 5 MG tablet Take 5 mg by mouth daily.     spironolactone (ALDACTONE) 25 MG tablet Take 25 mg by mouth daily.     terconazole (TERAZOL 3) 0.8 % vaginal cream Place 1 applicator vaginally at bedtime. 20 g 0   No current facility-administered medications on file prior to visit.    Allergies  Allergen Reactions   Sulfa Antibiotics Other (See Comments)    Neuropathy   Rosuvastatin Other (See Comments)    Pt reports its causing her creatinine to elevate.    Assessment/Plan:  1. Hyperlipidemia - Patient LDL 74 while on atorvastatin 40mg  in November 2021 which is slightly above goal.  However, patient now no longer taking any lipid lowering therapy so likely LDL has increased.  Next steps would be PCSK9i or Nexletol/Nexlizet.  Briefly explained mechanism of action of Nexlitol but recommended patient try Repatha for greater LDL lowering.  Explained mechanism of action of Repatha and showed patient video.  Using demo pen, explained storage, site selection, and administration.  Patient was able to demonstrate herself using demo pen.  Printed copay card for patient and will complete PA.  Entered lipid panel for patient for 5/22.  Patient prefers to have labs drawn at outside [facility].    Laural Golden, PharmD, BCACP, CDCES, CPP [facility] [address] [place], [place] [address] Phone: [phone]; Fax: [phone] 09/25/2020 5:03 PM

## 2020-09-25 NOTE — Patient Instructions (Addendum)
It was nice meeting you today!  We would like your LDL (bad cholesterol) to be less than 70  We will start a new medication called Repatha which you will inject once every 2 weeks  We will recheck your lipid panel in 2-3 months  Please call with any questions  Laural Golden, PharmD, BCACP, CDCES, CPP [facility] [address] [place], [place] [address] Phone: [phone]; Fax: [phone] 09/25/2020 10:08 AM

## 2020-09-30 ENCOUNTER — Telehealth: Payer: Self-pay | Admitting: Pharmacist

## 2020-09-30 NOTE — Telephone Encounter (Signed)
PA for Repatha approved through 09/24/21.  Called patient and she is aware

## 2020-10-05 ENCOUNTER — Ambulatory Visit (HOSPITAL_COMMUNITY)
Admission: RE | Admit: 2020-10-05 | Discharge: 2020-10-05 | Disposition: A | Discharge status: Home / Self Care | Payer: BC Managed Care – PPO | Source: Ambulatory Visit | Attending: Gastroenterology | Admitting: Gastroenterology

## 2020-10-05 ENCOUNTER — Other Ambulatory Visit: Payer: Self-pay

## 2020-10-05 DIAGNOSIS — R1011 Right upper quadrant pain: Secondary | ICD-10-CM | POA: Diagnosis not present

## 2020-10-05 MED ORDER — TECHNETIUM TC 99M MEBROFENIN IV KIT
5.5000 | PACK | Freq: Once | INTRAVENOUS | Status: AC | PRN
  Administered 2020-10-05: 5.5 via INTRAVENOUS

## 2020-10-08 DIAGNOSIS — R110 Nausea: Secondary | ICD-10-CM | POA: Diagnosis not present

## 2020-10-08 DIAGNOSIS — R197 Diarrhea, unspecified: Secondary | ICD-10-CM | POA: Diagnosis not present

## 2020-10-08 DIAGNOSIS — R1011 Right upper quadrant pain: Secondary | ICD-10-CM | POA: Diagnosis not present

## 2020-10-28 DIAGNOSIS — J479 Bronchiectasis, uncomplicated: Secondary | ICD-10-CM | POA: Diagnosis not present

## 2020-10-28 DIAGNOSIS — R918 Other nonspecific abnormal finding of lung field: Secondary | ICD-10-CM | POA: Diagnosis not present

## 2020-10-28 DIAGNOSIS — Z882 Allergy status to sulfonamides status: Secondary | ICD-10-CM | POA: Diagnosis not present

## 2020-12-02 ENCOUNTER — Other Ambulatory Visit: Payer: Self-pay

## 2020-12-02 MED ORDER — ESTRADIOL 0.05 MG/24HR TD PTTW: 1.0000 | MEDICATED_PATCH | TRANSDERMAL | 0 refills | Status: DC

## 2020-12-02 NOTE — Telephone Encounter (Signed)
AEX 03/02/20 Scheduled 03/03/21

## 2020-12-23 ENCOUNTER — Telehealth: Payer: Self-pay | Admitting: Pharmacist

## 2020-12-23 DIAGNOSIS — I10 Essential (primary) hypertension: Secondary | ICD-10-CM

## 2020-12-23 DIAGNOSIS — E785 Hyperlipidemia, unspecified: Secondary | ICD-10-CM

## 2020-12-23 LAB — COMPREHENSIVE METABOLIC PANEL
ALT: 16 IU/L (ref 0–32)
AST: 20 IU/L (ref 0–40)
Albumin/Globulin Ratio: 2.5 — ABNORMAL HIGH (ref 1.2–2.2)
Albumin: 4.9 g/dL — ABNORMAL HIGH (ref 3.8–4.8)
Alkaline Phosphatase: 53 IU/L (ref 44–121)
BUN/Creatinine Ratio: 19 (ref 12–28)
BUN: 19 mg/dL (ref 8–27)
Bilirubin Total: 0.6 mg/dL (ref 0.0–1.2)
CO2: 23 mmol/L (ref 20–29)
Calcium: 9.7 mg/dL (ref 8.7–10.3)
Chloride: 105 mmol/L (ref 96–106)
Creatinine, Ser: 1.01 mg/dL — ABNORMAL HIGH (ref 0.57–1.00)
Globulin, Total: 2 g/dL (ref 1.5–4.5)
Glucose: 102 mg/dL — ABNORMAL HIGH (ref 65–99)
Potassium: 4.4 mmol/L (ref 3.5–5.2)
Sodium: 143 mmol/L (ref 134–144)
Total Protein: 6.9 g/dL (ref 6.0–8.5)
eGFR: 62 mL/min/{1.73_m2} (ref >59)

## 2020-12-23 LAB — LIPID PANEL
Chol/HDL Ratio: 2.1 ratio (ref 0.0–4.4)
Cholesterol, Total: 135 mg/dL (ref 100–199)
HDL: 65 mg/dL (ref >39)
LDL Chol Calc (NIH): 56 mg/dL (ref 0–99)
Triglycerides: 71 mg/dL (ref 0–149)
VLDL Cholesterol Cal: 14 mg/dL (ref 5–40)

## 2020-12-23 MED ORDER — REPATHA SURECLICK 140 MG/ML ~~LOC~~ SOAJ: 1.0000 mL | SUBCUTANEOUS | 3 refills | Status: DC

## 2020-12-23 NOTE — Telephone Encounter (Signed)
Called patient regarding lipid panel.  LDL dropped to 56 which is at goal.  Will continue current medication management with Repatha. Is concerned regarding her bump in Scr.  Will update BMP in 2 months.

## 2021-01-04 NOTE — Telephone Encounter (Signed)
[employer] called requesting documents showing high ASCVD risk score or ASCVD event Fax # [phone] Documents faxed over

## 2021-02-10 ENCOUNTER — Other Ambulatory Visit: Payer: Self-pay | Admitting: Obstetrics and Gynecology

## 2021-02-11 NOTE — Telephone Encounter (Signed)
Annual exam scheduled on 03/03/21, mammogram due in Oct. 2022

## 2021-02-12 ENCOUNTER — Encounter: Payer: Self-pay | Admitting: Cardiology

## 2021-03-03 ENCOUNTER — Ambulatory Visit: Payer: BC Managed Care – PPO | Admitting: Obstetrics and Gynecology

## 2021-03-04 ENCOUNTER — Ambulatory Visit: Payer: BC Managed Care – PPO | Admitting: Obstetrics and Gynecology

## 2021-03-05 ENCOUNTER — Ambulatory Visit: Payer: BC Managed Care – PPO | Admitting: Cardiology

## 2021-03-17 ENCOUNTER — Ambulatory Visit (INDEPENDENT_AMBULATORY_CARE_PROVIDER_SITE_OTHER): Payer: Medicare Other | Admitting: Nurse Practitioner

## 2021-03-17 ENCOUNTER — Encounter: Payer: Self-pay | Admitting: Nurse Practitioner

## 2021-03-17 ENCOUNTER — Other Ambulatory Visit: Payer: Self-pay

## 2021-03-17 VITALS — BP 118/76 | Ht 65.5 in | Wt 141.0 lb

## 2021-03-17 DIAGNOSIS — Z780 Asymptomatic menopausal state: Secondary | ICD-10-CM | POA: Diagnosis not present

## 2021-03-17 DIAGNOSIS — Z79890 Hormone replacement therapy (postmenopausal): Secondary | ICD-10-CM

## 2021-03-17 DIAGNOSIS — Z01419 Encounter for gynecological examination (general) (routine) without abnormal findings: Secondary | ICD-10-CM

## 2021-03-17 DIAGNOSIS — Z9189 Other specified personal risk factors, not elsewhere classified: Secondary | ICD-10-CM

## 2021-03-17 MED ORDER — ESTRADIOL 0.05 MG/24HR TD PTTW: MEDICATED_PATCH | TRANSDERMAL | 4 refills | Status: DC

## 2021-03-17 NOTE — Progress Notes (Signed)
Arnice Vanepps Sing 1955/11/27 [id number]   History:  65 y.o. [id number] presents for breast and pelvic exam. Postmenopausal - on ERT. S/P Hysterectomy for 8 cm fibroid at age 79.  Normal pap history. Bilateral cysts seen on most recent diagnostic mammogram 04/24/2020.    Gynecologic History No LMP recorded. Patient has had a hysterectomy.   Contraception: status post hysterectomy Sexually active: Yes  Health Maintenance Last Pap: No longer screening per guidelines Last mammogram: 04/13/2020 followed by U/S 04/24/2020. Results were: Benign simple cysts and clustered microcysts in the bilateral breasts. No persistent, suspicious findings Last colonoscopy: 12/12/2019. Results were: Normal, 7-year recall Last Dexa: Never  Past medical history, past surgical history, family history and social history were all reviewed and documented in the EPIC chart. Married. 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 Mother with history of osteoporosis.   ROS:  A ROS was performed and pertinent positives and negatives are included.  Exam:  Vitals:   03/17/21 1400  BP: 118/76  Weight: 141 lb (64 kg)  Height: 5' 5.5" (1.664 m)   Body mass index is 23.11 kg/m.  General appearance:  Normal Thyroid:  Symmetrical, normal in size, without palpable masses or nodularity. Respiratory  Auscultation:  Clear without wheezing or rhonchi Cardiovascular  Auscultation:  Regular rate, without rubs, murmurs or gallops  Edema/varicosities:  Not grossly evident Abdominal  Soft,nontender, without masses, guarding or rebound.  Liver/spleen:  No organomegaly noted  Hernia:  None appreciated  Skin  Inspection:  Grossly normal Breasts: Examined lying and sitting. Bilateral implants noted  Right: Without masses, retractions, nipple discharge or axillary adenopathy.   Left: Without masses, retractions, nipple discharge or axillary adenopathy. Genitourinary   Inguinal/mons:  Normal without inguinal  adenopathy  External genitalia:  Normal appearing vulva with no masses, tenderness, or lesions  BUS/Urethra/Skene's glands:  Normal  Vagina: Atrophic changes  Cervix:  Absent  Uterus:  Absent  Anus and perineum: Normal  Digital rectal exam: Normal sphincter tone without palpated masses or tenderness  Patient informed chaperone available to be present for breast and pelvic exam. Patient has requested no chaperone to be present. Patient has been advised what will be completed during breast and pelvic exam.   Assessment/Plan:  65 y.o. [id number] for breast and pelvic exam.   Well female exam with routine gynecological exam - Education provided on SBEs, importance of preventative screenings, current guidelines, high calcium diet, regular exercise, and multivitamin daily. Labs with PCP.   Postmenopausal - Plan: DG Bone Density. On ERT.   Hormone replacement therapy - Plan: estradiol (VIVELLE-DOT) 0.05 MG/24HR patch twice weekly. She is aware of risks of continued use and would like to continue. Refill x 1 year provided.   At increased risk of breast cancer - Breast cancer risk of 24.6% with recommendations for annual mammogram and MRI. She has concerns about contrast due to history of kidney injury with use and prefers MRIs very 2-3 years. She is also aware of the increased risk with ERT. Normal breast exam today. Most recent diagnostic mammogram 04/2020 showed benign simple cysts and clustered microcysts in the bilateral breasts. No persistent, suspicious findings.  Screening for cervical cancer - Normal Pap history. No longer screening per guidelines.   Screening for colon cancer - 2021 colonoscopy. Will repeat at GI's recommended interval.   Screening for osteoporosis - Will schedule Dexa now. Mother with history of osteoporosis.   Return in 1 year for breast and pelvic exam.   Lizet Kelso Sylvie Farrier DNP, 2:41 PM  03/17/2021   

## 2021-03-30 ENCOUNTER — Other Ambulatory Visit: Payer: Self-pay | Admitting: Obstetrics and Gynecology

## 2021-03-30 ENCOUNTER — Ambulatory Visit (HOSPITAL_BASED_OUTPATIENT_CLINIC_OR_DEPARTMENT_OTHER): Payer: BC Managed Care – PPO | Admitting: Family

## 2021-03-30 DIAGNOSIS — Z1231 Encounter for screening mammogram for malignant neoplasm of breast: Secondary | ICD-10-CM

## 2021-03-31 ENCOUNTER — Other Ambulatory Visit: Payer: Self-pay

## 2021-03-31 ENCOUNTER — Other Ambulatory Visit: Payer: Self-pay | Admitting: Nurse Practitioner

## 2021-03-31 ENCOUNTER — Ambulatory Visit (INDEPENDENT_AMBULATORY_CARE_PROVIDER_SITE_OTHER): Payer: Medicare Other

## 2021-03-31 DIAGNOSIS — Z780 Asymptomatic menopausal state: Secondary | ICD-10-CM | POA: Diagnosis not present

## 2021-03-31 DIAGNOSIS — Z13820 Encounter for screening for osteoporosis: Secondary | ICD-10-CM

## 2021-04-27 ENCOUNTER — Encounter: Payer: Self-pay | Admitting: Nurse Practitioner

## 2021-04-30 ENCOUNTER — Other Ambulatory Visit: Payer: Self-pay

## 2021-04-30 ENCOUNTER — Encounter (HOSPITAL_BASED_OUTPATIENT_CLINIC_OR_DEPARTMENT_OTHER): Payer: Self-pay | Admitting: Family

## 2021-04-30 ENCOUNTER — Ambulatory Visit (INDEPENDENT_AMBULATORY_CARE_PROVIDER_SITE_OTHER): Payer: Medicare Other | Admitting: Family

## 2021-04-30 VITALS — BP 110/66 | HR 77 | Ht 65.5 in | Wt 144.5 lb

## 2021-04-30 DIAGNOSIS — I2510 Atherosclerotic heart disease of native coronary artery without angina pectoris: Secondary | ICD-10-CM | POA: Diagnosis not present

## 2021-04-30 DIAGNOSIS — I10 Essential (primary) hypertension: Secondary | ICD-10-CM | POA: Diagnosis not present

## 2021-04-30 DIAGNOSIS — E785 Hyperlipidemia, unspecified: Secondary | ICD-10-CM | POA: Diagnosis not present

## 2021-04-30 MED ORDER — ASPIRIN EC 81 MG PO TBEC: 81.0000 mg | DELAYED_RELEASE_TABLET | Freq: Every day | ORAL | 11 refills | Status: AC

## 2021-04-30 NOTE — Patient Instructions (Addendum)
Medication Instructions:  Your physician has recommended you make the following change in your medication:   START Aspirin EC 81mg  daily  *If you need a refill on your cardiac medications before your next appointment, please call your pharmacy*   Lab Work: None ordered today  Testing/Procedures: Your EKG today showed normal sinus rhythm with no abnormalities.    Follow-Up: At [facility] [place] [facility], you and your health needs are our priority.  As part of our continuing mission to provide you with exceptional heart care, we have created designated Provider Care Teams.  These Care Teams include your primary Cardiologist (physician) and Advanced Practice Providers (APPs -  Physician Assistants and Nurse Practitioners) who all work together to provide you with the care you need, when you need it.  We recommend signing up for the patient portal called "MyChart".  Sign up information is provided on this After Visit Summary.  MyChart is used to connect with patients for Virtual Visits (Telemedicine).  Patients are able to view lab/test results, encounter notes, upcoming appointments, etc.  Non-urgent messages can be sent to your provider as well.   To learn more about what you can do with MyChart, go to [facility].    Your next appointment:   1 year(s)  The format for your next appointment:   In Person  Provider:   You may see [email or web address], MD or one of the following Advanced Practice Providers on your designated Care Team:   Meriam Sprague, PA-C Vin Lowell, [place]  Other Instructions  For coronary artery disease often called "heart disease" we aim for optimal guideline directed medical therapy. We use the "A, B, C"s to help keep [place] on track!  A = Aspirin 81mg  daily B = Beta blocker which helps to relax the heart. This is your Nebivolol. C = Cholesterol control. You take Repatha to help control your cholesterol.

## 2021-04-30 NOTE — Progress Notes (Signed)
Office Visit    Patient Name: Donna King Date of Encounter: 05/01/2021  PCP:  Marden Noble, MD   [facility]  Cardiologist:  Meriam Sprague, MD  Advanced Practice Provider:  No care team member to display Electrophysiologist:  None   Chief Complaint    Donna King is a 65 y.o. 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    History of Present Illness    Donna King is a 65 y.o. 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 She and her husband enjoy competing in ballroom dancing contests. She is working with her dog on Fish farm manager and hoping to take her to training to become a therapy dog. Donna King has additionally been taking piano lessons.   She was followed by Dr. Delton See due to incidental finding of coronary calcium on CT. She has a family history notable for her father having MI and CABG in his 28s. October 2021 she had calcium score of 23 placing her in 68th percentile for age and sex  matched control. She was initially treated with statin but had kidney dysfunction. She was subsequently transitioned to Reaptha with good response.   She presents today for follow up and has been doing well since last seen. Reports no shortness of breath nor dyspnea on exertion. Reports no chest pain, pressure, or tightness. No edema, orthopnea, PND. Reports no palpitations.    EKGs/Labs/Other Studies Reviewed:   The following studies were reviewed today:   EKG:  EKG is ordered today.  The ekg ordered today demonstrates NSR 77 bpm with no acute ST/T wave changes.   Recent Labs: 12/22/2020: ALT 16; BUN 19; Creatinine, Ser 1.01; Potassium 4.4; Sodium 143  Recent Lipid Panel    Component Value Date/Time   CHOL 135 12/22/2020 1521   TRIG 71 12/22/2020 1521   HDL 65 12/22/2020 1521   CHOLHDL 2.1 12/22/2020 1521   CHOLHDL 3.2 11/02/2015 1604   VLDL 18 11/02/2015 1604   LDLCALC 56 12/22/2020 1521   Home Medications   Current Meds  Medication Sig   aspirin EC 81 MG tablet Take 1 tablet (81 mg total) by mouth daily. Swallow whole.   Cholecalciferol (VITAMIN D) 2000 units tablet Take 2,000 Units by mouth daily.   estradiol (VIVELLE-DOT) 0.05 MG/24HR patch APPLY 1 PATCH(0.05 MG) TOPICALLY TO THE SKIN 2 TIMES A WEEK   Evolocumab (REPATHA SURECLICK) 140 MG/ML SOAJ Inject 1 mL into the skin every 14 (fourteen) days.   fluticasone-salmeterol (ADVAIR) 250-50  MCG/ACT AEPB Advair Diskus 250 mcg-50 mcg/dose powder for inhalation  INHALE 1 PUFF BY MOUTH TWICE DAILY AS NEEDED   ipratropium (ATROVENT) 0.03 % nasal spray ipratropium bromide 0.03 % nasal spray   nebivolol (BYSTOLIC) 5 MG tablet Take 5 mg by mouth daily.   spironolactone (ALDACTONE) 25 MG tablet Take 25 mg by mouth daily.     Review of Systems      All other systems reviewed and are otherwise negative except as noted above.  Physical Exam    VS:  BP 110/66   Pulse 77   Ht 5' 5.5" (1.664 m)   Wt 144 lb 8 oz (65.5 kg)   BMI  23.68 kg/m  , BMI Body mass index is 23.68 kg/m.  Wt Readings from Last 3 Encounters:  04/30/21 144 lb 8 oz (65.5 kg)  03/17/21 141 lb (64 kg)  07/20/20 158 lb (71.7 kg)     GEN: Well nourished, well developed, in no acute distress. HEENT: normal. Neck: Supple, no JVD, carotid bruits, or masses. Cardiac: RRR, no murmurs, rubs, or gallops. No clubbing, cyanosis, edema.  Radials/PT 2+ and equal bilaterally.  Respiratory:  Respirations regular and unlabored, clear to auscultation bilaterally. GI: Soft, nontender, nondistended. MS: No deformity or atrophy. Skin: Warm and dry, no rash. Neuro:  Strength and sensation are intact. Psych: Normal affect.  Assessment & Plan    CAD - 04/2020 coronary calcium score of 23 placing her in 68th percentile for age and gender matched control. GDMT includes Repatha. Will start Aspirin EC 81mg  daily for additional  secondary prevention. Continue Nebivolol at present dose. Heart healthy diet and regular cardiovascular exercise encouraged.  She stays very active walking her dog as well as ballroom dancing competitively.   HTN - BP well controlled. Continue current antihypertensive regimen including Nebivolol and Spironolactone.   HLD - LDL goal <70. Normal creatinine and GFR by PCP 04/08/21. 12/22/20 total cholesterol 135, HDL 65, LDL 56, triglycerides 71. Continue Repatha.   Disposition: Follow up in 1 year(s) with Dr. 12/24/20 or APP.  Signed, Shari Prows, NP 05/01/2021, 4:26 PM [facility]

## 2021-05-11 ENCOUNTER — Ambulatory Visit
Admission: RE | Admit: 2021-05-11 | Discharge: 2021-05-11 | Disposition: A | Discharge status: Home / Self Care | Payer: Medicare Other | Source: Ambulatory Visit

## 2021-05-11 ENCOUNTER — Other Ambulatory Visit: Payer: Self-pay

## 2021-05-11 DIAGNOSIS — Z1231 Encounter for screening mammogram for malignant neoplasm of breast: Secondary | ICD-10-CM

## 2021-08-15 ENCOUNTER — Other Ambulatory Visit: Payer: Self-pay | Admitting: Obstetrics and Gynecology

## 2021-08-16 NOTE — Telephone Encounter (Signed)
AEX 03/17/21.

## 2021-10-04 ENCOUNTER — Other Ambulatory Visit: Payer: Self-pay | Admitting: Pharmacist

## 2021-10-04 DIAGNOSIS — E785 Hyperlipidemia, unspecified: Secondary | ICD-10-CM

## 2021-10-04 MED ORDER — REPATHA SURECLICK 140 MG/ML ~~LOC~~ SOAJ: 1.0000 mL | SUBCUTANEOUS | 3 refills | Status: DC

## 2021-10-13 ENCOUNTER — Encounter: Payer: Self-pay | Admitting: Cardiology

## 2021-12-08 ENCOUNTER — Telehealth: Payer: Self-pay | Admitting: *Deleted

## 2021-12-08 ENCOUNTER — Other Ambulatory Visit: Payer: Self-pay | Admitting: Nurse Practitioner

## 2021-12-08 ENCOUNTER — Encounter: Payer: Self-pay | Admitting: Nurse Practitioner

## 2021-12-08 DIAGNOSIS — Z79890 Hormone replacement therapy (postmenopausal): Secondary | ICD-10-CM

## 2021-12-08 MED ORDER — ESTRADIOL 0.075 MG/24HR TD PTTW: 1.0000 | MEDICATED_PATCH | TRANSDERMAL | 0 refills | Status: AC

## 2021-12-08 MED ORDER — ESTRADIOL 0.075 MG/24HR TD PTTW: 1.0000 | MEDICATED_PATCH | TRANSDERMAL | 0 refills | Status: DC

## 2021-12-08 NOTE — Telephone Encounter (Signed)
PA done via cover my med for estradiol  (vivelle dot) patch 0.075 mg tablet. Pending response from [facility].  ?

## 2021-12-09 NOTE — Telephone Encounter (Signed)
CaseId:[id number];Status:Approved;Review Type:Prior Auth;Coverage Start Date:11/08/2021;Coverage End Date:12/09/2022;  Pharmacy informed.

## 2022-03-22 ENCOUNTER — Ambulatory Visit: Payer: Medicare Other | Admitting: Obstetrics and Gynecology

## 2022-10-11 ENCOUNTER — Other Ambulatory Visit (HOSPITAL_COMMUNITY): Payer: Self-pay

## 2022-10-11 ENCOUNTER — Telehealth: Payer: Self-pay

## 2022-10-11 DIAGNOSIS — E785 Hyperlipidemia, unspecified: Secondary | ICD-10-CM

## 2022-10-11 MED ORDER — REPATHA SURECLICK 140 MG/ML ~~LOC~~ SOAJ: 1.0000 mL | SUBCUTANEOUS | 3 refills | Status: AC

## 2022-10-11 NOTE — Addendum Note (Signed)
Addended by: Finesse Fielder E on: 10/11/2022 12:19 PM   Modules accepted: Orders

## 2022-10-11 NOTE — Telephone Encounter (Signed)
Pharmacy Patient Advocate Encounter  Prior Authorization for REPATHA 140 MG/ML INJ has been approved.    PA# [id number] Effective dates: 09/11/22 through 10/11/23   Received notification from [facility] that prior authorization for REPATHA 140 MG/ML INJ is needed.    PA submitted on 10/11/22 Key [id number] Status is pending  Karie Soda, CPhT Pharmacy Patient Advocate Specialist Direct Number: [phone] Fax: [phone]
